# Patient Record
Sex: Female | Born: 1960
Health system: Southern US, Community
[De-identification: ages and names within clinical notes are randomized; demographics above are authoritative.]

## PROBLEM LIST (undated history)

## (undated) DIAGNOSIS — E039 Hypothyroidism, unspecified: Secondary | ICD-10-CM

## (undated) DIAGNOSIS — R011 Cardiac murmur, unspecified: Secondary | ICD-10-CM

## (undated) DIAGNOSIS — M549 Dorsalgia, unspecified: Secondary | ICD-10-CM

## (undated) DIAGNOSIS — K5730 Diverticulosis of large intestine without perforation or abscess without bleeding: Secondary | ICD-10-CM

## (undated) DIAGNOSIS — M25519 Pain in unspecified shoulder: Secondary | ICD-10-CM

## (undated) DIAGNOSIS — R0602 Shortness of breath: Secondary | ICD-10-CM

## (undated) DIAGNOSIS — D179 Benign lipomatous neoplasm, unspecified: Secondary | ICD-10-CM

## (undated) DIAGNOSIS — I340 Nonrheumatic mitral (valve) insufficiency: Secondary | ICD-10-CM

## (undated) DIAGNOSIS — R82998 Other abnormal findings in urine: Secondary | ICD-10-CM

## (undated) DIAGNOSIS — N62 Hypertrophy of breast: Secondary | ICD-10-CM

## (undated) DIAGNOSIS — E785 Hyperlipidemia, unspecified: Secondary | ICD-10-CM

## (undated) DIAGNOSIS — E663 Overweight: Secondary | ICD-10-CM

## (undated) DIAGNOSIS — D649 Anemia, unspecified: Secondary | ICD-10-CM

## (undated) DIAGNOSIS — I10 Essential (primary) hypertension: Secondary | ICD-10-CM

## (undated) DIAGNOSIS — J069 Acute upper respiratory infection, unspecified: Secondary | ICD-10-CM

## (undated) DIAGNOSIS — K219 Gastro-esophageal reflux disease without esophagitis: Secondary | ICD-10-CM

## (undated) HISTORY — DX: Cardiac murmur, unspecified: R01.1

## (undated) HISTORY — DX: Anemia, unspecified: D64.9

## (undated) HISTORY — DX: Overweight: E66.3

## (undated) HISTORY — DX: Dorsalgia, unspecified: M54.9

## (undated) HISTORY — DX: Acute upper respiratory infection, unspecified: J06.9

## (undated) HISTORY — DX: Other abnormal findings in urine: R82.998

## (undated) HISTORY — DX: Gastro-esophageal reflux disease without esophagitis: K21.9

## (undated) HISTORY — PX: TONSILLECTOMY AND ADENOIDECTOMY: SUR1326

## (undated) HISTORY — DX: Essential (primary) hypertension: I10

## (undated) HISTORY — DX: Diverticulosis of large intestine without perforation or abscess without bleeding: K57.30

## (undated) HISTORY — DX: Hypothyroidism, unspecified: E03.9

## (undated) HISTORY — DX: Pain in unspecified shoulder: M25.519

## (undated) HISTORY — DX: Hyperlipidemia, unspecified: E78.5

## (undated) HISTORY — PX: COLONOSCOPY: SHX174

## (undated) HISTORY — DX: Shortness of breath: R06.02

## (undated) HISTORY — DX: Nonrheumatic mitral (valve) insufficiency: I34.0

## (undated) HISTORY — DX: Hypertrophy of breast: N62

## (undated) HISTORY — DX: Benign lipomatous neoplasm, unspecified: D17.9

---

## 1999-09-28 ENCOUNTER — Other Ambulatory Visit: Admission: RE | Admit: 1999-09-28 | Discharge: 1999-09-28 | Payer: Self-pay | Admitting: Internal Medicine

## 1999-10-02 ENCOUNTER — Ambulatory Visit (HOSPITAL_COMMUNITY): Admission: RE | Admit: 1999-10-02 | Discharge: 1999-10-02 | Payer: Self-pay | Admitting: Internal Medicine

## 1999-10-02 ENCOUNTER — Encounter: Payer: Self-pay | Admitting: Internal Medicine

## 2000-11-03 ENCOUNTER — Ambulatory Visit (HOSPITAL_COMMUNITY): Admission: RE | Admit: 2000-11-03 | Discharge: 2000-11-03 | Payer: Self-pay | Admitting: Internal Medicine

## 2000-11-03 ENCOUNTER — Encounter: Payer: Self-pay | Admitting: Internal Medicine

## 2002-02-09 ENCOUNTER — Encounter: Payer: Self-pay | Admitting: Internal Medicine

## 2002-02-09 ENCOUNTER — Ambulatory Visit (HOSPITAL_COMMUNITY): Admission: RE | Admit: 2002-02-09 | Discharge: 2002-02-09 | Payer: Self-pay | Admitting: Internal Medicine

## 2002-07-08 ENCOUNTER — Other Ambulatory Visit: Admission: RE | Admit: 2002-07-08 | Discharge: 2002-07-08 | Payer: Self-pay | Admitting: Internal Medicine

## 2003-10-28 ENCOUNTER — Ambulatory Visit (HOSPITAL_COMMUNITY): Admission: RE | Admit: 2003-10-28 | Discharge: 2003-10-28 | Payer: Self-pay | Admitting: Internal Medicine

## 2004-06-07 ENCOUNTER — Ambulatory Visit: Payer: Self-pay | Admitting: Internal Medicine

## 2004-06-22 ENCOUNTER — Ambulatory Visit: Payer: Self-pay | Admitting: Internal Medicine

## 2004-06-27 ENCOUNTER — Ambulatory Visit: Payer: Self-pay | Admitting: Internal Medicine

## 2004-10-05 ENCOUNTER — Ambulatory Visit: Payer: Self-pay | Admitting: Pulmonary Disease

## 2004-10-10 ENCOUNTER — Ambulatory Visit: Payer: Self-pay | Admitting: Pulmonary Disease

## 2004-10-23 ENCOUNTER — Ambulatory Visit: Payer: Self-pay

## 2004-10-26 ENCOUNTER — Ambulatory Visit: Payer: Self-pay | Admitting: Cardiology

## 2004-11-01 ENCOUNTER — Ambulatory Visit: Payer: Self-pay

## 2004-11-05 ENCOUNTER — Ambulatory Visit (HOSPITAL_COMMUNITY): Admission: RE | Admit: 2004-11-05 | Discharge: 2004-11-05 | Payer: Self-pay | Admitting: Internal Medicine

## 2004-11-23 ENCOUNTER — Ambulatory Visit: Payer: Self-pay | Admitting: Cardiology

## 2004-11-26 ENCOUNTER — Encounter: Payer: Self-pay | Admitting: Internal Medicine

## 2004-11-26 LAB — CONVERTED CEMR LAB: Pap Smear: NORMAL

## 2005-02-04 ENCOUNTER — Ambulatory Visit: Payer: Self-pay | Admitting: Cardiology

## 2005-11-06 ENCOUNTER — Ambulatory Visit (HOSPITAL_COMMUNITY): Admission: RE | Admit: 2005-11-06 | Discharge: 2005-11-06 | Payer: Self-pay | Admitting: Internal Medicine

## 2005-12-12 ENCOUNTER — Ambulatory Visit: Payer: Self-pay | Admitting: Cardiology

## 2006-02-19 ENCOUNTER — Ambulatory Visit: Payer: Self-pay | Admitting: Internal Medicine

## 2006-02-20 ENCOUNTER — Ambulatory Visit: Payer: Self-pay | Admitting: Internal Medicine

## 2006-02-20 LAB — CONVERTED CEMR LAB
BUN: 7 mg/dL (ref 6–23)
Bacteria, U Microscopic: NEGATIVE /hpf
Basophils Absolute: 0.1 10*3/uL (ref 0.0–0.1)
Basophils Relative: 1.5 % — ABNORMAL HIGH (ref 0.0–1.0)
Chol/HDL Ratio, serum: 3.1
Crystals: NEGATIVE
GFR calc non Af Amer: 82 mL/min
Glucose, Bld: 96 mg/dL (ref 70–99)
LDL Cholesterol: 104 mg/dL — ABNORMAL HIGH (ref 0–99)
Leukocytes, UA: NEGATIVE
MCHC: 33.5 g/dL (ref 30.0–36.0)
MCV: 85.1 fL (ref 78.0–100.0)
Monocytes Absolute: 0.5 10*3/uL (ref 0.2–0.7)
Monocytes Relative: 9 % (ref 3.0–11.0)
Neutrophils Relative %: 49.4 % (ref 43.0–77.0)
Nitrite: NEGATIVE
Platelets: 411 10*3/uL — ABNORMAL HIGH (ref 150–400)
RBC: 4.15 M/uL (ref 3.87–5.11)
RDW: 14 % (ref 11.5–14.6)
TSH: 3.75 microintl units/mL (ref 0.35–5.50)
Total Protein, Urine: NEGATIVE mg/dL
Triglyceride fasting, serum: 63 mg/dL (ref 0–149)
Urobilinogen, UA: 0.2 (ref 0.0–1.0)
WBC: 5.7 10*3/uL (ref 4.5–10.5)

## 2006-11-18 ENCOUNTER — Ambulatory Visit (HOSPITAL_COMMUNITY): Admission: RE | Admit: 2006-11-18 | Discharge: 2006-11-18 | Payer: Self-pay | Admitting: Internal Medicine

## 2006-12-05 ENCOUNTER — Ambulatory Visit: Payer: Self-pay | Admitting: Internal Medicine

## 2006-12-05 LAB — CONVERTED CEMR LAB
AST: 19 units/L (ref 0–37)
Alkaline Phosphatase: 111 units/L (ref 39–117)
Bilirubin Urine: NEGATIVE
CO2: 30 meq/L (ref 19–32)
Chloride: 104 meq/L (ref 96–112)
Eosinophils Absolute: 0.2 10*3/uL (ref 0.0–0.6)
GFR calc Af Amer: 98 mL/min
GFR calc non Af Amer: 81 mL/min
Glucose, Bld: 97 mg/dL (ref 70–99)
Ketones, ur: NEGATIVE mg/dL
Leukocytes, UA: NEGATIVE
Lymphocytes Relative: 33.1 % (ref 12.0–46.0)
MCHC: 33.1 g/dL (ref 30.0–36.0)
Monocytes Relative: 9.8 % (ref 3.0–11.0)
Platelets: 397 10*3/uL (ref 150–400)
Potassium: 3.2 meq/L — ABNORMAL LOW (ref 3.5–5.1)
Sodium: 140 meq/L (ref 135–145)
TSH: 5.66 microintl units/mL — ABNORMAL HIGH (ref 0.35–5.50)
Total Protein, Urine: NEGATIVE mg/dL
VLDL: 12 mg/dL (ref 0–40)
WBC: 7 10*3/uL (ref 4.5–10.5)
pH: 6 (ref 5.0–8.0)

## 2006-12-08 ENCOUNTER — Encounter: Payer: Self-pay | Admitting: Internal Medicine

## 2006-12-08 ENCOUNTER — Ambulatory Visit: Payer: Self-pay | Admitting: Internal Medicine

## 2006-12-08 DIAGNOSIS — I10 Essential (primary) hypertension: Secondary | ICD-10-CM

## 2006-12-08 DIAGNOSIS — D649 Anemia, unspecified: Secondary | ICD-10-CM | POA: Insufficient documentation

## 2006-12-08 DIAGNOSIS — E876 Hypokalemia: Secondary | ICD-10-CM | POA: Insufficient documentation

## 2006-12-08 DIAGNOSIS — E785 Hyperlipidemia, unspecified: Secondary | ICD-10-CM

## 2006-12-08 DIAGNOSIS — M5431 Sciatica, right side: Secondary | ICD-10-CM | POA: Insufficient documentation

## 2006-12-08 DIAGNOSIS — M549 Dorsalgia, unspecified: Secondary | ICD-10-CM | POA: Insufficient documentation

## 2006-12-08 DIAGNOSIS — K219 Gastro-esophageal reflux disease without esophagitis: Secondary | ICD-10-CM

## 2006-12-08 HISTORY — DX: Hyperlipidemia, unspecified: E78.5

## 2006-12-08 HISTORY — DX: Essential (primary) hypertension: I10

## 2006-12-08 HISTORY — DX: Anemia, unspecified: D64.9

## 2006-12-08 HISTORY — DX: Dorsalgia, unspecified: M54.9

## 2006-12-08 HISTORY — DX: Gastro-esophageal reflux disease without esophagitis: K21.9

## 2006-12-10 ENCOUNTER — Telehealth: Payer: Self-pay | Admitting: Internal Medicine

## 2007-01-01 ENCOUNTER — Ambulatory Visit: Payer: Self-pay | Admitting: Internal Medicine

## 2007-01-02 ENCOUNTER — Ambulatory Visit: Payer: Self-pay | Admitting: Cardiology

## 2007-01-02 LAB — CONVERTED CEMR LAB
Basophils Absolute: 0 10*3/uL (ref 0.0–0.1)
Basophils Relative: 0.1 % (ref 0.0–1.0)
Eosinophils Absolute: 0.2 10*3/uL (ref 0.0–0.6)
Hemoglobin: 12.3 g/dL (ref 12.0–15.0)
Iron: 47 ug/dL (ref 42–145)
Lymphocytes Relative: 29 % (ref 12.0–46.0)
Monocytes Relative: 4.6 % (ref 3.0–11.0)
Platelets: 364 10*3/uL (ref 150–400)
Potassium: 4 meq/L (ref 3.5–5.1)
RBC: 4.36 M/uL (ref 3.87–5.11)

## 2007-03-09 ENCOUNTER — Ambulatory Visit: Payer: Self-pay | Admitting: Internal Medicine

## 2007-03-09 DIAGNOSIS — R197 Diarrhea, unspecified: Secondary | ICD-10-CM | POA: Insufficient documentation

## 2007-03-09 DIAGNOSIS — M25511 Pain in right shoulder: Secondary | ICD-10-CM | POA: Insufficient documentation

## 2007-03-09 DIAGNOSIS — M25519 Pain in unspecified shoulder: Secondary | ICD-10-CM

## 2007-03-09 HISTORY — DX: Pain in unspecified shoulder: M25.519

## 2007-05-14 ENCOUNTER — Encounter: Payer: Self-pay | Admitting: Internal Medicine

## 2007-05-25 ENCOUNTER — Encounter: Payer: Self-pay | Admitting: Internal Medicine

## 2007-11-19 ENCOUNTER — Ambulatory Visit (HOSPITAL_COMMUNITY): Admission: RE | Admit: 2007-11-19 | Discharge: 2007-11-19 | Payer: Self-pay | Admitting: Obstetrics and Gynecology

## 2007-12-14 ENCOUNTER — Encounter: Payer: Self-pay | Admitting: Internal Medicine

## 2007-12-30 ENCOUNTER — Ambulatory Visit: Payer: Self-pay | Admitting: Cardiology

## 2008-02-03 ENCOUNTER — Ambulatory Visit: Payer: Self-pay | Admitting: Internal Medicine

## 2008-02-04 LAB — CONVERTED CEMR LAB
ALT: 17 units/L (ref 0–35)
Albumin: 3.3 g/dL — ABNORMAL LOW (ref 3.5–5.2)
Alkaline Phosphatase: 103 units/L (ref 39–117)
Basophils Relative: 0.3 % (ref 0.0–3.0)
Calcium: 8.6 mg/dL (ref 8.4–10.5)
Chloride: 108 meq/L (ref 96–112)
Cholesterol: 175 mg/dL (ref 0–200)
Crystals: NEGATIVE
GFR calc Af Amer: 114 mL/min
GFR calc non Af Amer: 95 mL/min
HCT: 36.5 % (ref 36.0–46.0)
Ketones, ur: NEGATIVE mg/dL
MCHC: 32.8 g/dL (ref 30.0–36.0)
Monocytes Absolute: 0.9 10*3/uL (ref 0.1–1.0)
Potassium: 3.8 meq/L (ref 3.5–5.1)
Specific Gravity, Urine: 1.015 (ref 1.000–1.03)
TSH: 6.36 microintl units/mL — ABNORMAL HIGH (ref 0.35–5.50)
Total CHOL/HDL Ratio: 3.8
VLDL: 13 mg/dL (ref 0–40)
WBC: 9.9 10*3/uL (ref 4.5–10.5)
pH: 5.5 (ref 5.0–8.0)

## 2008-02-05 ENCOUNTER — Ambulatory Visit: Payer: Self-pay | Admitting: Internal Medicine

## 2008-02-05 DIAGNOSIS — E039 Hypothyroidism, unspecified: Secondary | ICD-10-CM

## 2008-02-05 DIAGNOSIS — N62 Hypertrophy of breast: Secondary | ICD-10-CM

## 2008-02-05 DIAGNOSIS — E079 Disorder of thyroid, unspecified: Secondary | ICD-10-CM | POA: Insufficient documentation

## 2008-02-05 HISTORY — DX: Hypothyroidism, unspecified: E03.9

## 2008-02-05 HISTORY — DX: Hypertrophy of breast: N62

## 2008-02-12 ENCOUNTER — Encounter (INDEPENDENT_AMBULATORY_CARE_PROVIDER_SITE_OTHER): Payer: Self-pay | Admitting: *Deleted

## 2008-08-11 ENCOUNTER — Telehealth (INDEPENDENT_AMBULATORY_CARE_PROVIDER_SITE_OTHER): Payer: Self-pay | Admitting: *Deleted

## 2008-08-12 ENCOUNTER — Telehealth: Payer: Self-pay | Admitting: Cardiology

## 2008-09-27 ENCOUNTER — Telehealth (INDEPENDENT_AMBULATORY_CARE_PROVIDER_SITE_OTHER): Payer: Self-pay | Admitting: *Deleted

## 2009-01-10 ENCOUNTER — Ambulatory Visit (HOSPITAL_COMMUNITY): Admission: RE | Admit: 2009-01-10 | Discharge: 2009-01-10 | Payer: Self-pay | Admitting: Obstetrics and Gynecology

## 2009-01-11 ENCOUNTER — Encounter (INDEPENDENT_AMBULATORY_CARE_PROVIDER_SITE_OTHER): Payer: Self-pay | Admitting: *Deleted

## 2009-01-12 DIAGNOSIS — R079 Chest pain, unspecified: Secondary | ICD-10-CM | POA: Insufficient documentation

## 2009-01-12 DIAGNOSIS — R0602 Shortness of breath: Secondary | ICD-10-CM

## 2009-01-12 HISTORY — DX: Shortness of breath: R06.02

## 2009-01-24 ENCOUNTER — Ambulatory Visit: Payer: Self-pay | Admitting: Cardiology

## 2009-02-03 ENCOUNTER — Ambulatory Visit: Payer: Self-pay | Admitting: Internal Medicine

## 2009-02-06 ENCOUNTER — Ambulatory Visit: Payer: Self-pay | Admitting: Internal Medicine

## 2009-02-06 LAB — CONVERTED CEMR LAB
AST: 19 units/L (ref 0–37)
Basophils Absolute: 0 10*3/uL (ref 0.0–0.1)
Basophils Relative: 0.7 % (ref 0.0–3.0)
Bilirubin Urine: NEGATIVE
CO2: 27 meq/L (ref 19–32)
Calcium: 8.4 mg/dL (ref 8.4–10.5)
Creatinine, Ser: 0.7 mg/dL (ref 0.4–1.2)
GFR calc non Af Amer: 113.72 mL/min (ref >60)
Glucose, Bld: 82 mg/dL (ref 70–99)
HCT: 33.9 % — ABNORMAL LOW (ref 36.0–46.0)
Iron: 37 ug/dL — ABNORMAL LOW (ref 42–145)
Ketones, ur: NEGATIVE mg/dL
Leukocytes, UA: NEGATIVE
Lymphs Abs: 2.2 10*3/uL (ref 0.7–4.0)
MCV: 77.4 fL — ABNORMAL LOW (ref 78.0–100.0)
Monocytes Absolute: 0.6 10*3/uL (ref 0.1–1.0)
Neutrophils Relative %: 53 % (ref 43.0–77.0)
Nitrite: NEGATIVE
Potassium: 3.3 meq/L — ABNORMAL LOW (ref 3.5–5.1)
Saturation Ratios: 8 % — ABNORMAL LOW (ref 20.0–50.0)
Specific Gravity, Urine: 1.02 (ref 1.000–1.030)
TSH: 5.65 microintl units/mL — ABNORMAL HIGH (ref 0.35–5.50)
Total CHOL/HDL Ratio: 3
Urobilinogen, UA: 0.2 (ref 0.0–1.0)
VLDL: 14.4 mg/dL (ref 0.0–40.0)
WBC: 6.7 10*3/uL (ref 4.5–10.5)
pH: 6 (ref 5.0–8.0)

## 2009-03-20 ENCOUNTER — Encounter (INDEPENDENT_AMBULATORY_CARE_PROVIDER_SITE_OTHER): Payer: Self-pay | Admitting: *Deleted

## 2009-03-24 ENCOUNTER — Ambulatory Visit: Payer: Self-pay | Admitting: Internal Medicine

## 2009-03-24 LAB — CONVERTED CEMR LAB
Chloride: 106 meq/L (ref 96–112)
Creatinine, Ser: 0.8 mg/dL (ref 0.4–1.2)
HCT: 35 % — ABNORMAL LOW (ref 36.0–46.0)
Iron: 18 ug/dL — ABNORMAL LOW (ref 42–145)
MCV: 78.8 fL (ref 78.0–100.0)
Monocytes Relative: 9.1 % (ref 3.0–12.0)
Neutro Abs: 4.2 10*3/uL (ref 1.4–7.7)
Platelets: 370 10*3/uL (ref 150.0–400.0)
RBC: 4.44 M/uL (ref 3.87–5.11)
Saturation Ratios: 4.2 % — ABNORMAL LOW (ref 20.0–50.0)
Sodium: 139 meq/L (ref 135–145)
WBC: 7.8 10*3/uL (ref 4.5–10.5)

## 2009-04-21 ENCOUNTER — Encounter (INDEPENDENT_AMBULATORY_CARE_PROVIDER_SITE_OTHER): Payer: Self-pay | Admitting: *Deleted

## 2009-04-24 ENCOUNTER — Ambulatory Visit: Payer: Self-pay | Admitting: Gastroenterology

## 2009-05-05 ENCOUNTER — Ambulatory Visit: Payer: Self-pay | Admitting: Gastroenterology

## 2009-08-25 LAB — HM MAMMOGRAPHY: HM Mammogram: NORMAL

## 2009-11-22 ENCOUNTER — Ambulatory Visit: Payer: Self-pay | Admitting: Internal Medicine

## 2009-11-22 LAB — CONVERTED CEMR LAB
Albumin: 3.6 g/dL (ref 3.5–5.2)
Alkaline Phosphatase: 103 units/L (ref 39–117)
Basophils Absolute: 0 10*3/uL (ref 0.0–0.1)
Basophils Relative: 0.5 % (ref 0.0–3.0)
Bilirubin Urine: NEGATIVE
Bilirubin, Direct: 0 mg/dL (ref 0.0–0.3)
Calcium: 9 mg/dL (ref 8.4–10.5)
Eosinophils Relative: 5.1 % — ABNORMAL HIGH (ref 0.0–5.0)
GFR calc non Af Amer: 97.17 mL/min (ref >60)
Glucose, Bld: 90 mg/dL (ref 70–99)
Hemoglobin: 11.8 g/dL — ABNORMAL LOW (ref 12.0–15.0)
Ketones, ur: NEGATIVE mg/dL
MCHC: 32.9 g/dL (ref 30.0–36.0)
MCV: 80.7 fL (ref 78.0–100.0)
Monocytes Absolute: 0.4 10*3/uL (ref 0.1–1.0)
Monocytes Relative: 8 % (ref 3.0–12.0)
Neutrophils Relative %: 43.7 % (ref 43.0–77.0)
Nitrite: NEGATIVE
Platelets: 338 10*3/uL (ref 150.0–400.0)
Sodium: 142 meq/L (ref 135–145)
Specific Gravity, Urine: >=1.03 (ref 1.000–1.030)
TSH: 2.73 microintl units/mL (ref 0.35–5.50)
Total Bilirubin: 0.3 mg/dL (ref 0.3–1.2)
Total CHOL/HDL Ratio: 4
Triglycerides: 98 mg/dL (ref 0.0–149.0)
Urine Glucose: NEGATIVE mg/dL
Urobilinogen, UA: 0.2 (ref 0.0–1.0)
WBC: 5.4 10*3/uL (ref 4.5–10.5)

## 2009-11-23 ENCOUNTER — Ambulatory Visit: Payer: Self-pay | Admitting: Internal Medicine

## 2009-11-23 DIAGNOSIS — R82998 Other abnormal findings in urine: Secondary | ICD-10-CM | POA: Insufficient documentation

## 2009-11-23 DIAGNOSIS — J069 Acute upper respiratory infection, unspecified: Secondary | ICD-10-CM | POA: Insufficient documentation

## 2009-11-23 HISTORY — DX: Other abnormal findings in urine: R82.998

## 2009-11-23 HISTORY — DX: Acute upper respiratory infection, unspecified: J06.9

## 2010-01-11 ENCOUNTER — Ambulatory Visit: Payer: Self-pay | Admitting: Cardiology

## 2010-01-11 ENCOUNTER — Encounter: Payer: Self-pay | Admitting: Cardiology

## 2010-01-11 DIAGNOSIS — E663 Overweight: Secondary | ICD-10-CM | POA: Insufficient documentation

## 2010-01-11 HISTORY — DX: Overweight: E66.3

## 2010-01-23 ENCOUNTER — Ambulatory Visit (HOSPITAL_COMMUNITY): Admission: RE | Admit: 2010-01-23 | Payer: Self-pay | Admitting: Obstetrics and Gynecology

## 2010-02-25 HISTORY — PX: BREAST REDUCTION SURGERY: SHX8

## 2010-03-17 ENCOUNTER — Encounter: Payer: Self-pay | Admitting: Obstetrics and Gynecology

## 2010-03-27 NOTE — Assessment & Plan Note (Signed)
Summary: yearly f/u ./cy   Visit Type:  1 yr f/u Primary Provider:  Corwin Levins MD  CC:  pt offers no cardiac complaints today.  History of Present Illness:  [name] [name] comes in today for followup of her chest discomfort and cardiac risk factors. She is followed very closely by primary care.  She has chest discomfort described as a midsternal burning when she lays down. It is not related to exertion. There is no nausea or vomiting. She denies abdominal pain or melena.  She's compliant with her medications. She is put off exercise program and continues to show with her weight.  Her lipids were reviewed with her today. Her total cholesterol was 176, triglycerides 98, HDL 45, LDL 111. Fasting blood sugar was normal.    Current Medications (verified): 1)  Ecotrin Low Strength 81 Mg  Tbec (Aspirin) .Marland Kitchen.. 1 Once Daily Po 2)  Klor-Con 10 10 Meq Cr-Tabs (Potassium Chloride) .Marland Kitchen.. 1 By Mouth Once Daily 3)  Diovan Hct 80-12.5 Mg Tabs (Valsartan-Hydrochlorothiazide) .Marland Kitchen.. 1 By Mouth Once Daily 4)  Levothyroxine Sodium 50 Mcg Tabs (Levothyroxine Sodium) .Marland Kitchen.. 1 By Mouth Once Daily  Allergies: 1)  Darvon  Past History:  Past Medical History: Last updated: 02/06/2009 CHEST PAIN (ICD-786.50) SHORTNESS OF BREATH (ICD-786.05) HYPERLIPIDEMIA (ICD-272.4) HYPERTENSION (ICD-401.9) GERD (ICD-530.81) BREAST HYPERTROPHY (ICD-611.1) HYPOTHYROIDISM (ICD-244.9) PREVENTIVE HEALTH CARE (ICD-V70.0) DIARRHEA (ICD-787.91) SHOULDER PAIN, RIGHT (ICD-719.41) - right bone spur  BACK PAIN (ICD-724.5) HYPOKALEMIA (ICD-276.8) PREVENTIVE HEALTH CARE (ICD-V70.0) FAMILY HISTORY BREAST CANCER 1ST DEGREE RELATIVE <50 (ICD-V16.3) ANEMIA-NOS (ICD-285.9)    Past Surgical History: Last updated: 12/08/2006 none  Family History: Last updated: 02/05/2008 Family History Breast cancer 1st degree relative - 2 aunts and 2 cousins mother with DM, several strokes, HTN sister and brother with HTN as well   Social  History: Last updated: 11/23/2009 Never Smoked Alcohol use-yes 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  no acute distress Head:  normocephalic and atraumatic Eyes:  PERRLA/EOM intact; conjunctiva and lids normal. Neck:  Neck supple, no JVD. No masses, thyromegaly or abnormal cervical nodes. Chest [name] [name]:  no deformities or breast masses noted Lungs:  Clear bilaterally to auscultation and percussion. Heart:  PMI poorly appreciated, soft S1-S2, regular rate and rhythm, no bruits Msk:  Back normal, normal gait. Muscle strength and tone normal. Pulses:  pulses normal in all 4 extremities Extremities:  No clubbing or cyanosis. Neurologic:  Alert and oriented x 3. Skin:  Intact without lesions or rashes. Psych:  Normal affect.   Problems:  Medical Problems Added: 1)  Dx of Overweight/obesity  (ICD-278.02)  Impression & Recommendations:  Problem # 1:  CHEST PAIN (ICD-786.50) Assessment Improved  Her chest discomfort and frequent and clearly sounds like reflux. Her updated medication list for this problem includes:    Ecotrin Low Strength 81 Mg  Tbec (Aspirin) .Marland Kitchen... 1 once daily po  Orders: EKG w/ Interpretation (93000)  Problem # 2:  HYPERLIPIDEMIA (ICD-272.4) I have advised losing weight and exercise to bring her HDL above 50.  Problem # 3:  HYPERTENSION (ICD-401.9) Assessment: Improved  Her updated medication list for this problem includes:    Ecotrin Low Strength 81 Mg Tbec  (Aspirin) .Marland Kitchen... 1 once daily po    Diovan Hct 80-12.5 Mg Tabs (Valsartan-hydrochlorothiazide) .Marland Kitchen... 1 by mouth once daily  Patient Instructions: 1)  Your physician recommends that you schedule a follow-up appointment in: 1 year with Dr. Daleen Squibb 2)  Your physician recommends that you continue on your current medications as directed. Please refer to the Current Medication list given to you today.

## 2010-03-27 NOTE — Procedures (Signed)
Summary: Colonoscopy  Patient: [name] Note: All result statuses are Final unless otherwise noted.  Tests: (1) Colonoscopy (COL)   COL Colonoscopy           DONE     [facility]     [address]     [place], [place]  [address]           COLONOSCOPY PROCEDURE REPORT           PATIENT:  [name], [name]  MR#:  [id number]     BIRTHDATE:  1960/07/30, 48 yrs. old  GENDER:  female           ENDOSCOPIST:  Barbette Hair. Arlyce Dice, MD     Referred by:  Oliver Barre, M.D.           PROCEDURE DATE:  05/05/2009     PROCEDURE:  Colonoscopy, Diagnostic     ASA CLASS:  Class II     INDICATIONS:  Routine Risk Screening           MEDICATIONS:   Fentanyl 75 mcg IV, Versed 8 mg IV           DESCRIPTION OF PROCEDURE:   After the risks benefits and     alternatives of the procedure were thoroughly explained, informed     consent was obtained.  Digital rectal exam was performed and     revealed no abnormalities.   The LB [id number] endoscope     was introduced through the anus and advanced to the cecum, which     was identified by both the appendix and ileocecal valve, without     limitations.  The quality of the prep was excellent, using     MoviPrep.  The instrument was then slowly withdrawn as the colon     was fully examined.     <<PROCEDUREIMAGES>>           FINDINGS:  Mild diverticulosis was found sigmoid to descending .     The exam was otherwise normal (see image1, image2, image3, image6,     image7, image9, image10, image15, and image16).   Retroflexed     views in the rectum revealed no abnormalities.    The scope was     then withdrawn from the patient and the procedure completed.           COMPLICATIONS:  None           ENDOSCOPIC IMPRESSION:     1) Mild diverticulosis in the sigmoid to descending           RECOMMENDATIONS:     1) Continue current colorectal screening recommendations for     "routine risk" patients with a repeat colonoscopy in 10 years.          REPEAT EXAM:  In 10 year(s) for Colonoscopy.           ______________________________     Barbette Hair. Arlyce Dice, MD           CC:           n.     eSIGNED:   Barbette Hair. Roniesha Hollingshead at 05/05/2009 10:49 AM           Carleene Overlie, [id number]  Note: An exclamation mark (!) indicates a result that was not dispersed into the flowsheet. Document Creation Date: 05/05/2009 10:50 AM _______________________________________________________________________  (1) Order result status: Final Collection or observation date-time: 05/05/2009 10:42 Requested date-time:  Receipt date-time:  Reported date-time:  Referring  Physician:   Ordering Physician: Melvia Heaps [phone]) Specimen Source:  Source: Launa Grill Order Number: [id number] Lab site:   Appended Document: Colonoscopy    Clinical Lists Changes  Observations: Added new observation of COLONNXTDUE: 04/2019 (05/05/2009 11:14)

## 2010-03-27 NOTE — Assessment & Plan Note (Signed)
Summary: FU Natale Milch  #   Vital Signs:  Patient profile:   50 year old female Height:      62 inches Weight:      188.38 pounds BMI:     34.58 O2 Sat:      98 % on Room air Temp:     99 degrees F oral Pulse rate:   87 / minute BP sitting:   110 / 78  (left arm) Cuff size:   regular  Vitals Entered By: Zella Ball Ewing CMA Duncan Dull) (November 23, 2009 3:29 PM)  O2 Flow:  Room air  Preventive Care Screening  Pap Smear:    Date:  08/25/2009    Results:  normal   Mammogram:    Date:  08/25/2009    Results:  normal   CC: followup/RE   Primary Care Provider:  Corwin Levins MD  CC:  followup/RE.  History of Present Illness: here for wellness, incidently with 2 to 3 days nausea and dizziness;  was unaware of fever today, just seemed to get over a recent URI;  recetnly joined the gym for more excericse; trying to lose wt for a wedding next yr;   Pt denies CP, worsening sob, doe, wheezing, orthopnea, pnd, worsening LE edema, palps, dizziness or syncope  Pt denies new neuro symptoms such as headache, facial or extremity weakness  Pt denies polydipsia, polyuria, or low sugar symptoms such as shakiness improved with eating.  Overall good compliance with meds, trying to follow low chol diet, wt stable, little excercise however    Preventive Screening-Counseling & Management      Drug Use:  no.    Problems Prior to Update: 1)  Uri  (ICD-465.9) 2)  Urinalysis, Abnormal  (ICD-791.9) 3)  Chest Pain  (ICD-786.50) 4)  Shortness of Breath  (ICD-786.05) 5)  Hyperlipidemia  (ICD-272.4) 6)  Hypertension  (ICD-401.9) 7)  Gerd  (ICD-530.81) 8)  Breast Hypertrophy  (ICD-611.1) 9)  Hypothyroidism  (ICD-244.9) 10)  Preventive Health Care  (ICD-V70.0) 11)  Diarrhea  (ICD-787.91) 12)  Shoulder Pain, Right  (ICD-719.41) 13)  Back Pain  (ICD-724.5) 14)  Hypokalemia  (ICD-276.8) 15)  Preventive Health Care  (ICD-V70.0) 16)  Family History Breast Cancer 1st Degree Relative <50  (ICD-V16.3) 17)   Anemia-nos  (ICD-285.9)  Medications Prior to Update: 1)  Ecotrin Low Strength 81 Mg  Tbec (Aspirin) .Marland Kitchen.. 1 Once Daily Po 2)  Klor-Con 10 10 Meq Cr-Tabs (Potassium Chloride) .... 2 By Mouth Once Daily 3)  Diovan Hct 80-12.5 Mg Tabs (Valsartan-Hydrochlorothiazide) .Marland Kitchen.. 1 By Mouth Once Daily 4)  Levothyroxine Sodium 50 Mcg Tabs (Levothyroxine Sodium) .Marland Kitchen.. 1 By Mouth Once Daily  Current Medications (verified): 1)  Ecotrin Low Strength 81 Mg  Tbec (Aspirin) .Marland Kitchen.. 1 Once Daily Po 2)  Klor-Con 10 10 Meq Cr-Tabs (Potassium Chloride) .Marland Kitchen.. 1 By Mouth Once Daily 3)  Diovan Hct 80-12.5 Mg Tabs (Valsartan-Hydrochlorothiazide) .Marland Kitchen.. 1 By Mouth Once Daily 4)  Levothyroxine Sodium 50 Mcg Tabs (Levothyroxine Sodium) .Marland Kitchen.. 1 By Mouth Once Daily  Allergies (verified): 1)  Darvon  Past History:  Past Medical History: Last updated: 02/06/2009 CHEST PAIN (ICD-786.50) SHORTNESS OF BREATH (ICD-786.05) HYPERLIPIDEMIA (ICD-272.4) HYPERTENSION (ICD-401.9) GERD (ICD-530.81) BREAST HYPERTROPHY (ICD-611.1) HYPOTHYROIDISM (ICD-244.9) PREVENTIVE HEALTH CARE (ICD-V70.0) DIARRHEA (ICD-787.91) SHOULDER PAIN, RIGHT (ICD-719.41) - right bone spur  BACK PAIN (ICD-724.5) HYPOKALEMIA (ICD-276.8) PREVENTIVE HEALTH CARE (ICD-V70.0) FAMILY HISTORY BREAST CANCER 1ST DEGREE RELATIVE <50 (ICD-V16.3) ANEMIA-NOS (ICD-285.9)    Past Surgical History: Last updated: 12/08/2006 none  Family History: Last updated: 02/05/2008 Family History Breast cancer 1st degree relative - 2 aunts and 2 cousins mother with DM, several strokes, HTN sister and brother with HTN as well   Social History: Last updated: 11/23/2009 Never Smoked Alcohol use-yes 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 Drug use-no  Risk Factors: Smoking Status: never (12/08/2006)  Social History: Never Smoked Alcohol use-yes 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         all otherwise negative per pt -  denies dysuria or freq/urgency  Physical Exam  General:  alert and overweight-appearing.   Head:  normocephalic and atraumatic.   Eyes:  vision grossly intact, pupils equal, and pupils round.   Ears:  left tm mild erythema, canals clear Nose:  no external deformity and no nasal discharge.   Mouth:  pharyngeal erythema and fair dentition.   Neck:  supple and no masses.   Lungs:  normal respiratory effort and normal breath sounds.   Heart:  normal rate and regular rhythm.   Abdomen:  soft, non-tender, and normal bowel sounds.   Msk:  no joint tenderness and no joint swelling.   Extremities:  no edema, no erythema  Neurologic:  cranial nerves II-XII intact and strength normal in all extremities.   Skin:  color normal and no rashes.   Psych:  not anxious appearing and not depressed appearing.     Impression & Recommendations:  Problem # 1:  Preventive Health Care (ICD-V70.0) Overall doing well, age appropriate education and counseling updated and referral for appropriate preventive services done unless declined, immunizations up to date or declined, diet counseling done if overweight, urged to quit smoking if smokes , most recent labs reviewed and current ordered if appropriate, ecg reviewed or declined (interpretation per ECG scanned in the EMR if done); information regarding Medicare Prevention requirements given if appropriate; speciality referrals updated as appropriate    Problem # 2:  HYPOKALEMIA (ICD-276.8) resolved, now K 5.2 - ok to reduce the K to one pill per day  Problem # 3:  HYPERTENSION (ICD-401.9)  Her updated medication list for this problem includes:    Diovan Hct 80-12.5 Mg Tabs (Valsartan-hydrochlorothiazide) .Marland Kitchen... 1 by mouth once daily  BP today: 110/78 Prior BP: 110/70 (02/06/2009)  Prior 10 Yr Risk Heart Disease: 5 % (01/24/2009)  Labs Reviewed: K+: 5.2 (11/22/2009) Creat: : 0.8 (11/22/2009)   Chol: 176 (11/22/2009)   HDL: 45.30 (11/22/2009)   LDL: 111 (11/22/2009)   TG: 98.0 (11/22/2009) stable overall by hx and exam, ok to continue meds/tx as is   Problem # 4:  HYPERLIPIDEMIA (ICD-272.4)  Labs Reviewed: SGOT: 21 (11/22/2009)   SGPT: 19 (11/22/2009)  Prior 10 Yr Risk Heart Disease: 5 % (01/24/2009)   HDL:45.30 (11/22/2009), 52.60 (02/03/2009)  LDL:111 (11/22/2009), 115 (02/03/2009)  Chol:176 (11/22/2009), 182 (02/03/2009)  Trig:98.0 (11/22/2009), 72.0 (02/03/2009) d/w pt - for diet for now, declines statin  Problem # 5:  URINALYSIS, ABNORMAL (ICD-791.9) pt concernd about pyuria which is minor and essenatily asmpt but will check  urine cx Orders: T-Culture, Urine ([id number])  Problem # 6:  ANEMIA-NOS (ICD-285.9) very mild, ok for MVI with iron supplement  Problem # 7:  URI (ICD-465.9)  Her updated medication list for this problem includes:    Ecotrin Low Strength 81 Mg Tbec (Aspirin) .Marland Kitchen... 1 once daily po mild, resolving, with left eustachain dysfucntion, mild vertigo and nausea - for mucinex otc as needed   Complete Medication List: 1)  Ecotrin Low Strength 81 Mg Tbec (Aspirin) .Marland Kitchen.. 1 once daily po 2)  Klor-con 10 10 Meq Cr-tabs (Potassium chloride) .Marland Kitchen.. 1 by mouth once daily 3)  Diovan Hct 80-12.5 Mg Tabs (Valsartan-hydrochlorothiazide) .Marland Kitchen.. 1 by mouth once daily 4)  Levothyroxine Sodium 50 Mcg Tabs (Levothyroxine sodium) .Marland Kitchen.. 1 by mouth once daily  Patient Instructions: 1)  Please go to the Lab in the basement  for your urine tests today  (the urine culture) 2)  Please call the number on the [facility] Card for results of your testing  3)  You can also use Mucinex OTC or it's generic for congestion  4)  Continue all previous medications as before this visit  5)  Please schedule a follow-up appointment in 1 year, or sooner if needed Prescriptions: LEVOTHYROXINE SODIUM 50 MCG TABS (LEVOTHYROXINE SODIUM) 1 by mouth once daily  #90 x 3   Entered and Authorized by:   Corwin Levins MD   Signed by:   Corwin Levins MD on 11/23/2009   Method used:   Print then Give to Patient   RxID:   [id number] DIOVAN HCT 80-12.5 MG TABS (VALSARTAN-HYDROCHLOROTHIAZIDE) 1 by mouth once daily  #90 x 3   Entered and Authorized by:   Corwin Levins MD   Signed by:   Corwin Levins MD on 11/23/2009   Method used:   Print then Give to Patient   RxID:   [id number] KLOR-CON 10 10 MEQ CR-TABS (POTASSIUM CHLORIDE) 1 by mouth once daily  #90 x 3   Entered and Authorized by:   Corwin Levins MD   Signed by:   Corwin Levins MD on 11/23/2009   Method used:   Print then Give to Patient   RxID:   [id number]

## 2010-03-27 NOTE — Letter (Signed)
Summary: [facility] Instructions  [facility]  [address] [place], [place] [address]   Phone: [phone]  Fax: [phone]       [name] [name]    03/27/1960    MRN: [id number]        Procedure Day Dorna Bloom:  Farrell Ours  05/05/09     Arrival Time:  9:00AM     Procedure Time:  10:00AM     Location of Procedure:                    X  [facility] (4th Floor)                        PREPARATION FOR COLONOSCOPY WITH MOVIPREP   Starting 5 days prior to your procedure 04/30/09 do not eat nuts, seeds, popcorn, corn, beans, peas,  salads, or any raw vegetables.  Do not take any fiber supplements (e.g. Metamucil, Citrucel, and Benefiber).  THE DAY BEFORE YOUR PROCEDURE         DATE: 05/04/09  DAY: THURSDAY  1.  Drink clear liquids the entire day-NO SOLID FOOD  2.  Do not drink anything colored red or purple.  Avoid juices with pulp.  No orange juice.  3.  Drink at least 64 oz. (8 glasses) of fluid/clear liquids during the day to prevent dehydration and help the prep work efficiently.  CLEAR LIQUIDS INCLUDE: Water Jello Ice Popsicles Tea (sugar ok, no milk/cream) Powdered fruit flavored drinks Coffee (sugar ok, no milk/cream) Gatorade Juice: apple, white grape, white cranberry  Lemonade Clear bullion, consomm, broth Carbonated beverages (any kind) Strained chicken noodle soup Hard Candy                             4.  In the morning, mix first dose of MoviPrep solution:    Empty 1 Pouch A and 1 Pouch B into the disposable container    Add lukewarm drinking water to the top line of the container. Mix to dissolve    Refrigerate (mixed solution should be used within 24 hrs)  5.  Begin drinking the prep at 5:00 p.m. The MoviPrep container is divided by 4 marks.   Every 15 minutes drink the solution down to the next mark (approximately 8 oz) until the full liter is complete.   6.  Follow completed prep with 16 oz of clear liquid of your choice (Nothing  red or purple).  Continue to drink clear liquids until bedtime.  7.  Before going to bed, mix second dose of MoviPrep solution:    Empty 1 Pouch A and 1 Pouch B into the disposable container    Add lukewarm drinking water to the top line of the container. Mix to dissolve    Refrigerate  THE DAY OF YOUR PROCEDURE      DATE: 05/05/09 DAY: FRIDAY  Beginning at 5:00AM 5 hours before procedure):         1. Every 15 minutes, drink the solution down to the next mark (approx 8 oz) until the full liter is complete.  2. Follow completed prep with 16 oz. of clear liquid of your choice.    3. You may drink clear liquids until 8:00AM (2 HOURS BEFORE PROCEDURE).   MEDICATION INSTRUCTIONS  Unless otherwise instructed, you should take regular prescription medications with a small sip of water   as early as possible the morning of your  procedure.    Additional medication instructions: Hold diovan morning of procedure         OTHER INSTRUCTIONS  You will need a responsible adult at least 52 years of age to accompany you and drive you home.   This person must remain in the waiting room during your procedure.  Wear loose fitting clothing that is easily removed.  Leave jewelry and other valuables at home.  However, you may wish to bring a book to read or  an iPod/MP3 player to listen to music as you wait for your procedure to start.  Remove all body piercing jewelry and leave at home.  Total time from sign-in until discharge is approximately 2-3 hours.  You should go home directly after your procedure and rest.  You can resume normal activities the  day after your procedure.  The day of your procedure you should not:   Drive   Make legal decisions   Operate machinery   Drink alcohol   Return to work  You will receive specific instructions about eating, activities and medications before you leave.    The above instructions have been reviewed and explained to me by   Sherren Kerns RN  April 24, 2009 5:00 PM     I fully understand and can verbalize these instructions _____________________________ Date _________

## 2010-03-27 NOTE — Letter (Signed)
Summary: Previsit letter  [facility]  [address] [place], [place] [address]   Phone: [phone]  Fax: [phone]       03/20/2009 MRN: [id number]  [facility] [address] [place], [place]  [address]  Dear Ms. Bhandari,  Welcome to the Gastroenterology Division at [facility].    You are scheduled to see a nurse for your pre-procedure visit on 04/12/2009 at 2:00PM on the 3rd floor at [facility], [address]  We ask that you try to arrive at our office 15 minutes prior to your appointment time to allow for check-in.  Your nurse visit will consist of discussing your medical and surgical history, your immediate family medical history, and your medications.    Please bring a complete list of all your medications or, if you prefer, bring the medication bottles and we will list them.  We will need to be aware of both prescribed and over the counter drugs.  We will need to know exact dosage information as well.  If you are on blood thinners (Coumadin, Plavix, Aggrenox, Ticlid, etc.) please call our office today/prior to your appointment, as we need to consult with your physician about holding your medication.   Please be prepared to read and sign documents such as consent forms, a financial agreement, and acknowledgement forms.  If necessary, and with your consent, a friend or relative is welcome to sit-in on the nurse visit with you.  Please bring your insurance card so that we may make a copy of it.  If your insurance requires a referral to see a specialist, please bring your referral form from your primary care physician.  No co-pay is required for this nurse visit.     If you cannot keep your appointment, please call [phone] to cancel or reschedule prior to your appointment date.  This allows [place] the opportunity to schedule an appointment for another patient in need of care.    Thank you for choosing [facility] for your  medical needs.  We appreciate the opportunity to care for you.  Please visit [place] at our website  to learn more about our practice.                     Sincerely.                                                                                                                   The Gastroenterology Division

## 2010-03-27 NOTE — Miscellaneous (Signed)
Summary: previsit/rm  Clinical Lists Changes  Medications: Added new medication of MOVIPREP 100 GM  SOLR (PEG-KCL-NACL-NASULF-NA ASC-C) As per prep instructions. - Signed Rx of MOVIPREP 100 GM  SOLR (PEG-KCL-NACL-NASULF-NA ASC-C) As per prep instructions.;  #1 x 0;  Signed;  Entered by: Sherren Kerns RN;  Authorized by: Louis Meckel MD;  Method used: Electronically to [facility] [address], [place], [place], [place]  [address], Ph: [phone], Fax: [phone] Observations: Added new observation of ALLERGY REV: Done (04/24/2009 16:14)    Prescriptions: MOVIPREP 100 GM  SOLR (PEG-KCL-NACL-NASULF-NA ASC-C) As per prep instructions.  #1 x 0   Entered by:   Sherren Kerns RN   Authorized by:   Louis Meckel MD   Signed by:   Sherren Kerns RN on 04/24/2009   Method used:   Electronically to        [facility] (retail)       [address]       [place], [place]  [address]       Ph: [phone]       Fax: [phone]   RxID:   [id number]

## 2010-04-18 ENCOUNTER — Other Ambulatory Visit: Payer: Self-pay | Admitting: Internal Medicine

## 2010-04-18 ENCOUNTER — Encounter (INDEPENDENT_AMBULATORY_CARE_PROVIDER_SITE_OTHER): Payer: Self-pay | Admitting: *Deleted

## 2010-04-18 ENCOUNTER — Ambulatory Visit (INDEPENDENT_AMBULATORY_CARE_PROVIDER_SITE_OTHER): Payer: Self-pay | Admitting: Internal Medicine

## 2010-04-18 ENCOUNTER — Encounter: Payer: Self-pay | Admitting: Internal Medicine

## 2010-04-18 ENCOUNTER — Telehealth: Payer: Self-pay | Admitting: Internal Medicine

## 2010-04-18 ENCOUNTER — Other Ambulatory Visit: Payer: 59

## 2010-04-18 DIAGNOSIS — K5730 Diverticulosis of large intestine without perforation or abscess without bleeding: Secondary | ICD-10-CM | POA: Insufficient documentation

## 2010-04-18 DIAGNOSIS — R51 Headache: Secondary | ICD-10-CM

## 2010-04-18 DIAGNOSIS — M25579 Pain in unspecified ankle and joints of unspecified foot: Secondary | ICD-10-CM

## 2010-04-18 DIAGNOSIS — R509 Fever, unspecified: Secondary | ICD-10-CM

## 2010-04-18 DIAGNOSIS — D179 Benign lipomatous neoplasm, unspecified: Secondary | ICD-10-CM | POA: Insufficient documentation

## 2010-04-18 DIAGNOSIS — R519 Headache, unspecified: Secondary | ICD-10-CM | POA: Insufficient documentation

## 2010-04-18 HISTORY — DX: Diverticulosis of large intestine without perforation or abscess without bleeding: K57.30

## 2010-04-18 HISTORY — DX: Benign lipomatous neoplasm, unspecified: D17.9

## 2010-04-18 LAB — URINALYSIS, ROUTINE W REFLEX MICROSCOPIC
Total Protein, Urine: NEGATIVE
pH: 7 (ref 5.0–8.0)

## 2010-04-24 NOTE — Progress Notes (Signed)
----   Converted from flag ---- ---- 04/18/2010 1:09 PM, Corwin Levins MD wrote: unless she has a health savings accnt and does it for tax purposes, this is not needed  ---- 04/18/2010 12:54 PM, Zella Ball Ewing CMA (AAMA) wrote: this pt. meant to ask you for a prescription for ASA thinks may be more economical than getting OTC ------------------------------  called pt left msg. to call back called pt left msg. to call back  patient has been informed

## 2010-04-24 NOTE — Assessment & Plan Note (Signed)
Summary: KNOT ON AKLE/NWS   Vital Signs:  Patient profile:   50 year old female Height:      62 inches Weight:      193 pounds BMI:     35.43 O2 Sat:      97 % on Room air Temp:     99.2 degrees F oral Pulse rate:   88 / minute BP sitting:   100 / 70  (left arm) Cuff size:   regular  Vitals Entered By: Zella Ball Ewing CMA Duncan Dull) (April 18, 2010 11:32 AM)  O2 Flow:  Room air CC: Knot on right ankle/RE   Primary Care Provider:  Corwin Levins MD  CC:  Knot on right ankle/RE.  History of Present Illness: here for workin acute visit - overall doing ok except has notice a lump to the right lateral ankle that is soft, nontender with rednes , has been there at least 6 mo and not sure if it is getting larger or not;  does also have some dicomfort off and on to the ankle itself without swelling, though no gait change and not activity limiting.  No prior hx of ankle problems;  does have high arches to both feet and have not been an issue previously.  Pt denies CP, worsening sob, doe, wheezing, orthopnea, pnd, worsening LE edema, palps, dizziness or syncope  Pt denies new neuro symptoms such as facial or extremity weakness, but has had recurrant mild throbbing headache, mild with phonophobia lasting a few hrs todays  several times in the past month.    Pt denies polydipsia, polyuria.  Pt is unaware of any fever today;  no HA, ST, cough;  also no dysuria, urgency or hematira, or back pain, but may have had some urinary  freq in the past few days.  No recent wt loss, night sweats, loss of appetite or other constitutional symptoms Denies worsening depressive symptoms, suicidal ideation, or panic, though has ongoing anxiety, no worse recetnly per pt  Problems Prior to Update: 1)  Headache  (ICD-784.0) 2)  Fever Unspecified  (ICD-780.60) 3)  Ankle Pain, Right  (ICD-719.47) 4)  Lipoma  (ICD-214.9) 5)  Diverticulosis, Colon  (ICD-562.10) 6)  Overweight/obesity  (ICD-278.02) 7)  Uri  (ICD-465.9) 8)   Urinalysis, Abnormal  (ICD-791.9) 9)  Chest Pain  (ICD-786.50) 10)  Shortness of Breath  (ICD-786.05) 11)  Hyperlipidemia  (ICD-272.4) 12)  Hypertension  (ICD-401.9) 13)  Gerd  (ICD-530.81) 14)  Breast Hypertrophy  (ICD-611.1) 15)  Hypothyroidism  (ICD-244.9) 16)  Preventive Health Care  (ICD-V70.0) 17)  Diarrhea  (ICD-787.91) 18)  Shoulder Pain, Right  (ICD-719.41) 19)  Back Pain  (ICD-724.5) 20)  Hypokalemia  (ICD-276.8) 21)  Preventive Health Care  (ICD-V70.0) 22)  Family History Breast Cancer 1st Degree Relative <50  (ICD-V16.3) 23)  Anemia-nos  (ICD-285.9)  Medications Prior to Update: 1)  Ecotrin Low Strength 81 Mg  Tbec (Aspirin) .Marland Kitchen.. 1 Once Daily Po 2)  Klor-Con 10 10 Meq Cr-Tabs (Potassium Chloride) .Marland Kitchen.. 1 By Mouth Once Daily 3)  Diovan Hct 80-12.5 Mg Tabs (Valsartan-Hydrochlorothiazide) .Marland Kitchen.. 1 By Mouth Once Daily 4)  Levothyroxine Sodium 50 Mcg Tabs (Levothyroxine Sodium) .Marland Kitchen.. 1 By Mouth Once Daily  Current Medications (verified): 1)  Ecotrin Low Strength 81 Mg  Tbec (Aspirin) .Marland Kitchen.. 1 Once Daily Po 2)  Klor-Con 10 10 Meq Cr-Tabs (Potassium Chloride) .Marland Kitchen.. 1 By Mouth Once Daily 3)  Diovan Hct 80-12.5 Mg Tabs (Valsartan-Hydrochlorothiazide) .Marland Kitchen.. 1 By Mouth Once Daily 4)  Levothyroxine  Sodium 50 Mcg Tabs (Levothyroxine Sodium) .Marland Kitchen.. 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 Drug use-no  Risk Factors: Smoking Status: never (12/08/2006)  Past Medical History: CHEST PAIN (ICD-786.50) SHORTNESS OF BREATH (ICD-786.05) HYPERLIPIDEMIA (ICD-272.4) HYPERTENSION (ICD-401.9) GERD (ICD-530.81) BREAST HYPERTROPHY (ICD-611.1) HYPOTHYROIDISM (ICD-244.9) PREVENTIVE HEALTH CARE (ICD-V70.0) DIARRHEA (ICD-787.91) SHOULDER PAIN, RIGHT  (ICD-719.41) - right bone spur  BACK PAIN (ICD-724.5) HYPOKALEMIA (ICD-276.8) PREVENTIVE HEALTH CARE (ICD-V70.0) FAMILY HISTORY BREAST CANCER 1ST DEGREE RELATIVE <50 (ICD-V16.3) ANEMIA-NOS (ICD-285.9)   Diverticulosis, colon  Review of Systems       all otherwise negative per pt -    Physical Exam  General:  alert and overweight-appearing.   Head:  normocephalic and atraumatic.   Eyes:  vision grossly intact, pupils equal, and pupils round.   Ears:  R ear normal and L ear normal.   Nose:  no external deformity and no nasal discharge.   Mouth:  no gingival abnormalities and pharynx pink and moist.   Neck:  supple and no masses.   Lungs:  normal respiratory effort and normal breath sounds.   Heart:  normal rate and regular rhythm.   Msk:  no flank tender bilat; right foot and ankle with redness, sweling, tender or decreaesed ROM Extremities:  no edema, no erythema  Neurologic:  cranial nerves II-XII intact, strength normal in all extremities, gait normal, and DTRs symmetrical and normal.   Skin:  color normal and no rashes.  but does have prob lipoma type lesion subq at the right lateral mallealar Psych:  not depressed appearing and moderately anxious.     Impression & Recommendations:  Problem # 1:  LIPOMA (ICD-214.9)  lateral right ankle - ? increased in size ; and with vague ankle and foot pain - to refer to ortho/dr bednarz per pr request  Orders: Orthopedic Surgeon Referral (Ortho Surgeon)  Problem # 2:  ANKLE PAIN, RIGHT (ICD-719.47) as above , seems overall minor - treat as above, f/u any worsening signs or symptoms , tylenol as needed  Orders: Orthopedic Surgeon Referral (Ortho Surgeon)  Problem # 3:  FEVER UNSPECIFIED (ICD-780.60) exam benign, to check labs below; follow with expectant management  Orders: TLB-Udip w/ Micro (81001-URINE) T-Culture, Urine ([id number])  Problem # 4:  HEADACHE (ICD-784.0)  Her updated medication list for this problem  includes:    Ecotrin Low Strength 81 Mg Tbec (Aspirin) .Marland Kitchen... 1 once daily po recurrent, most c/w mild migraine - for OTC exxcedrin migraine as needed   Complete Medication List: 1)  Ecotrin Low Strength 81 Mg Tbec (Aspirin) .Marland Kitchen.. 1 once daily po 2)  Klor-con 10 10 Meq Cr-tabs (Potassium chloride) .Marland Kitchen.. 1 by mouth once daily 3)  Diovan Hct 80-12.5 Mg Tabs (Valsartan-hydrochlorothiazide) .Marland Kitchen.. 1 by mouth once daily 4)  Levothyroxine Sodium 50 Mcg Tabs (Levothyroxine sodium) .Marland Kitchen.. 1 by mouth once daily  Patient Instructions: 1)  You will be contacted about the referral(s) to: orthopedic - Dr Lestine Box 2)  You can also use Excedrin migraine OTC or it's generic for headache 3)  Please go to the Lab in the basement for your urine tests today  4)  Please call the number on the [facility] Card for results of your testing  5)  Please schedule a follow-up appointment as needed.  Orders Added: 1)  Orthopedic Surgeon Referral [Ortho Surgeon] 2)  TLB-Udip w/ Micro [81001-URINE] 3)  T-Culture, Urine [[id number]] 4)  Est. Patient Level IV [78469]

## 2010-05-24 ENCOUNTER — Encounter: Payer: Self-pay | Admitting: Internal Medicine

## 2010-07-06 ENCOUNTER — Telehealth: Payer: Self-pay | Admitting: Cardiology

## 2010-07-06 NOTE — Telephone Encounter (Signed)
Pt calling re having surgery on Tuesday, will be under anesthesia and wants to make sure this is ok

## 2010-07-06 NOTE — Telephone Encounter (Signed)
Pt is having breast reduction surgery on Tuesday with Dr. Stephens November.  She denies any chest discomfort, angina, or edema.  I will forward to Dr. Daleen Squibb and return call to pt Monday. Mylo Red RN

## 2010-07-09 ENCOUNTER — Telehealth: Payer: Self-pay | Admitting: *Deleted

## 2010-07-09 NOTE — Telephone Encounter (Signed)
Pt returned call from today regarding surgical clearance.  Please call her back.

## 2010-07-09 NOTE — Telephone Encounter (Signed)
Pt is aware per Dr. Daleen Squibb she is cleared for surgery. Mylo Red RN

## 2010-07-09 NOTE — Telephone Encounter (Signed)
Per Dr. Daleen Squibb pt is cleared for mammoplasty reduction surgery with Dr. Stephens November. Left telephone number call back as voice mail was full. Mylo Red RN

## 2010-07-10 NOTE — Assessment & Plan Note (Signed)
[facility]                            CARDIOLOGY OFFICE NOTE   NAME:[name], [name]                     MRN:          [id number]  DATE:12/30/2007                            DOB:          Oct 15, 1960    [name] is a delightful 50 year old African American female,  who comes today to establish with me as her cardiologist.  She had been  a former patient of Dr. Simona [name].   She is also followed by Dr. Oliver [name] in Primary Care.   Her original evaluation by Dr. Diona [name] was for shortness of breath and  chest pain.  She had a stress Myoview on November 01, 2004, which showed  EF of 60% with soft tissue attenuation from breast tissue.   She is having no ischemic symptoms other than just getting short of  breath when she overdoes it.  She is probably deconditioned and has a  problem with her weight.  She rarely admits this.   She denies any orthopnea, PND, or peripheral edema.   CURRENT MEDICATIONS:  1. Diovan hydrochlorothiazide 80 mg/20.5 mg per day.  2. Aspirin 81 mg a day.  3. Potassium 10 mEq a day.   PHYSICAL EXAMINATION:  Her blood pressure today is 116/77, her pulse is  76 and regular, weight is 194.  HEENT is normal.  Carotid upstrokes were  equal bilaterally without bruits, no JVD.  Thyroid is not enlarged.  Trachea is midline.  Neck is supple.  Lungs are clear to auscultation  and percussion.  Heart reveals a nondisplaced PMI.  Normal S1 and S2.  No S4.  Abdominal exam is soft.  Good bowel sounds.  No pulsatile mass.  No organomegaly.  Extremities reveal no cyanosis, clubbing, or edema.  Pulses are intact.   EKG is normal.   I spent 20+ minutes getting to know [name] [name] today, answering  questions.  I have asked her to try to walk at least 3 hours a week or  to exercise 3 hours a week.  At this point in time, I do not see any  reason for any further ischemic evaluation.   I will see her back in a year.     [name]  C. Daleen Squibb, MD, [facility]  Electronically Signed    TCW/MedQ  DD: 12/30/2007  DT: 12/31/2007  Job #: [id number]

## 2010-07-10 NOTE — Assessment & Plan Note (Signed)
[facility]                            CARDIOLOGY OFFICE NOTE   NAME:[name] [name], [name] [name]                     MRN:          [id number]  DATE:01/02/2007                            DOB:          August 27, 1960    DATE OF VISIT:  January 02, 2007.   PRIMARY CARE PHYSICIAN:  Corwin Levins, MD.   REASON FOR VISIT:  Routine followup.   HISTORY OF PRESENT ILLNESS:  I saw [name] [name] back in October of last  year.  She states that overall she has been doing fairly well.  She is  not bothered by any limiting dyspnea on exertion and has had no  significant chest pain, palpitations, orthopnea, or claudication.  She  had a recent physical with Dr. Jonny Ruiz back in October.  She is now on a  potassium supplement and otherwise continues on her  Diovan/hydrochlorothiazide with good blood pressure control.  She is not  reporting any lower extremity edema.   ALLERGIES:  No known drug allergies.   PRESENT MEDICATIONS:  1. Aspirin 81 mg p.o. daily.  2. Diovan/HCT 80/12.5 mg p.o. daily.  3. Potassium 10 mEq p.o. daily.  4. Multivitamin and calcium supplements.   REVIEW OF SYSTEMS:  As described in the History of Present Illness.  .   PHYSICAL EXAMINATION:  VITAL SIGNS:  Weight is 188 pounds, which is from  181 last year, blood pressure is 130/86, heart rate is 92.  GENERAL:  The patient is comfortable and in no acute distress.  NECK:  No elevated jugulovenous pressure, no loud bruits.  LUNGS:  Clear without labored breathing.  CARDIAC:  Regular rate and rhythm, no S3 gallop, no pericardial rub.  EXTREMITIES:  No pitting edema.   IMPRESSION AND RECOMMENDATIONS:  1. Hypertension, relatively well controlled.  Our plan will be to      continue Diovan/hydrochlorothiazide.  Agree with potassium      supplement.  I have provided her a prescription for her      antihypertensive today.  2. Otherwise, continue basic risk factor modification as per Dr. Jonny Ruiz.      She is not  experiencing any limiting dyspnea on exertion and had a      relatively low risk Myoview back in 2006.  I      have asked her to be observant for any new symptom changes, and we      will otherwise plan to see her back in one year's time.     Jonelle Sidle, MD  Electronically Signed    SGM/MedQ  DD: 01/02/2007  DT: 01/02/2007  Job #: [id number]   cc:   Corwin Levins, MD

## 2010-07-13 NOTE — Assessment & Plan Note (Signed)
[facility]                              CARDIOLOGY OFFICE NOTE   NAME:[name] [name] GEISEL                     MRN:          [id number]  DATE:12/12/2005                            DOB:          1960-03-12    PRIMARY CARE PHYSICIAN:  Corwin Levins, M.D.   REASON FOR VISIT:  Followup dyspnea and hypertension.   HISTORY OF PRESENT ILLNESS:  [name] returns for a six-month.  She  states that in general she has been left breathless with activity.  She  continues to take medicine outlined below.  Her electrocardiogram today  showed sinus rhythm with nonspecific ST changes and no marked changes  compared to the prior tracing.  She denies any problems with palpitations or  syncope.  She has been trying to work in some weight loss but recently went  on a cruise and gained some of the weight back she had lost back.  She is  working with [employer] and is more focused on getting back to her  regular walking.  She is not reporting any reproducible exertional chest  pain.   ALLERGIES:  No known drug allergies.   PRESENT MEDICATIONS:  1. Enteric-coated aspirin 81 mg p.o. daily.  2. Diovan/HCT 80/12.5 mg p.o. daily.   REVIEW OF SYSTEMS:  As described in history of present illness.   PHYSICAL EXAMINATION:  VITAL SIGNS:  Blood pressure 115/80, heart rate 71,  weight 181 pounds, down from 187 in September 2006.  GENERAL:  She is comfortable and in no acute distress.  NECK:  No elevated jugular venous pressure.  No thyromegaly noted.  No  carotid bruits.  LUNGS:  Clear without labored breathing.  CARDIAC:  Regular rate and rhythm without murmur or gallop.  EXTREMITIES:  No pitting edema.   IMPRESSION/RECOMMENDATIONS:  1. Hypertension, well controlled at this point.  2. Prior dyspnea on exertion, improved.  She had a low risk Myoview as      outlined before.  I      told her to be observant of any changes in symptoms that may prompt      additional  need for testing.  I will otherwise plan to see her back      over the next six months.       Jonelle Sidle, MD     SGM/MedQ  DD:  12/12/2005  DT:  12/14/2005  Job #:  [id number]   cc:   Corwin Levins, MD

## 2010-08-17 ENCOUNTER — Encounter: Payer: Self-pay | Admitting: Internal Medicine

## 2010-08-17 ENCOUNTER — Ambulatory Visit (INDEPENDENT_AMBULATORY_CARE_PROVIDER_SITE_OTHER): Payer: 59 | Admitting: Internal Medicine

## 2010-08-17 ENCOUNTER — Other Ambulatory Visit (INDEPENDENT_AMBULATORY_CARE_PROVIDER_SITE_OTHER): Payer: 59

## 2010-08-17 ENCOUNTER — Ambulatory Visit: Payer: 59 | Admitting: Internal Medicine

## 2010-08-17 VITALS — BP 110/72 | HR 110 | Temp 100.1°F | Ht 62.0 in | Wt 179.5 lb

## 2010-08-17 DIAGNOSIS — Z0000 Encounter for general adult medical examination without abnormal findings: Secondary | ICD-10-CM

## 2010-08-17 DIAGNOSIS — I10 Essential (primary) hypertension: Secondary | ICD-10-CM

## 2010-08-17 DIAGNOSIS — R509 Fever, unspecified: Secondary | ICD-10-CM

## 2010-08-17 DIAGNOSIS — Z0001 Encounter for general adult medical examination with abnormal findings: Secondary | ICD-10-CM | POA: Insufficient documentation

## 2010-08-17 DIAGNOSIS — E039 Hypothyroidism, unspecified: Secondary | ICD-10-CM

## 2010-08-17 LAB — CBC WITH DIFFERENTIAL/PLATELET
Basophils Relative: 0.3 % (ref 0.0–3.0)
Eosinophils Relative: 0.2 % (ref 0.0–5.0)
HCT: 31.9 % — ABNORMAL LOW (ref 36.0–46.0)
Hemoglobin: 10.4 g/dL — ABNORMAL LOW (ref 12.0–15.0)
Lymphs Abs: 1.4 10*3/uL (ref 0.7–4.0)
MCV: 79.5 fl (ref 78.0–100.0)
Monocytes Absolute: 0.7 10*3/uL (ref 0.1–1.0)
Neutro Abs: 8.3 10*3/uL — ABNORMAL HIGH (ref 1.4–7.7)
Platelets: 483 10*3/uL — ABNORMAL HIGH (ref 150.0–400.0)
RBC: 4.02 Mil/uL (ref 3.87–5.11)
WBC: 10.6 10*3/uL — ABNORMAL HIGH (ref 4.5–10.5)

## 2010-08-17 LAB — HEPATIC FUNCTION PANEL
AST: 31 U/L (ref 0–37)
Total Bilirubin: 0.2 mg/dL — ABNORMAL LOW (ref 0.3–1.2)

## 2010-08-17 LAB — BASIC METABOLIC PANEL
BUN: 13 mg/dL (ref 6–23)
CO2: 27 mEq/L (ref 19–32)
Calcium: 8.5 mg/dL (ref 8.4–10.5)
Chloride: 98 mEq/L (ref 96–112)
GFR: 118.89 mL/min (ref >60.00)

## 2010-08-17 LAB — URINALYSIS, ROUTINE W REFLEX MICROSCOPIC
Leukocytes, UA: NEGATIVE
Nitrite: NEGATIVE
Total Protein, Urine: NEGATIVE
Urobilinogen, UA: 0.2 (ref 0.0–1.0)

## 2010-08-17 LAB — SEDIMENTATION RATE: Sed Rate: 57 mm/hr — ABNORMAL HIGH (ref 0–22)

## 2010-08-17 NOTE — Patient Instructions (Signed)
Continue all other medications as before Please go to LAB in the Basement for the blood and/or urine tests to be done today Please go to XRAY in the Basement for the x-ray test Please call the phone number [phone] (the PhoneTree System) for results of testing in 2-3 days;  When calling, simply dial the number, and when prompted enter the MRN number above (the Medical Record Number) and the # key, then the message should start. Please return in 3 mo with Lab testing done 3-5 days before

## 2010-08-17 NOTE — Progress Notes (Signed)
Subjective:    Patient ID: [name] [name], female    DOB: 1960/03/23, 50 y.o.   MRN: [id number]  HPI Here with febrile illness for 2 wks , without obvious source.  Is s/p breast reduction surgury may 15, now s/p 2 wks intermittent ill feeling with low grade temp, chills, sweats, nausea, HA without other such as ST, cough, abd pain, vomiting, diarrhea.  Pt denies chest pain, increased sob or doe, wheezing, orthopnea, PND, increased LE swelling, palpitations, dizziness or syncope.  Pt denies new neurological symptoms such as facial or extremity weakness or numbness. No back pain.   Pt denies polydipsia, polyuria.  Saw Dr Stephens November approx 1 wks ago and reassured no signifcant infection related to breast surgury.  Has lost 6 lbs due to decreased appetitie. Denies hyper or hypo thyroid symptoms such as voice, skin or hair change.  Past Medical History  Diagnosis Date  . ANEMIA-NOS 12/08/2006  . ANKLE PAIN, RIGHT 04/18/2010  . BACK PAIN 12/08/2006  . BREAST HYPERTROPHY 02/05/2008  . CHEST PAIN 01/12/2009  . Diarrhea 03/09/2007  . DIVERTICULOSIS, COLON 04/18/2010  . FEVER UNSPECIFIED 04/18/2010  . GERD 12/08/2006  . Headache 04/18/2010  . HYPERLIPIDEMIA 12/08/2006  . HYPERTENSION 12/08/2006  . HYPOKALEMIA 12/08/2006  . HYPOTHYROIDISM 02/05/2008  . LIPOMA 04/18/2010  . OVERWEIGHT/OBESITY 01/11/2010  . Shortness of breath 01/12/2009  . SHOULDER PAIN, RIGHT 03/09/2007  . URINALYSIS, ABNORMAL 11/23/2009  . URI 11/23/2009   No past surgical history on file.  reports that she has never smoked. She does not have any smokeless tobacco history on file. She reports that she drinks alcohol. She reports that she does not use illicit drugs. family history includes Cancer in her cousin and other; Diabetes in her mother; Hypertension in her brother, mother, and sister; and Stroke in her mother. Allergies  Allergen Reactions  . Propoxyphene Hcl     REACTION: nausea   Current Outpatient Prescriptions on File  Prior to Visit  Medication Sig Dispense Refill  . levothyroxine (SYNTHROID, LEVOTHROID) 50 MCG tablet Take 50 mcg by mouth daily.        . potassium chloride (KLOR-CON) 10 MEQ CR tablet Take 10 mEq by mouth daily.        . valsartan-hydrochlorothiazide (DIOVAN-HCT) 80-12.5 MG per tablet Take 1 tablet by mouth daily.        Marland Kitchen aspirin 81 MG EC tablet Take 81 mg by mouth daily.         Review of Systems Review of Systems  Constitutional: Negative for diaphoresis and unexpected weight change.  HENT: Negative for drooling and tinnitus.   Eyes: Negative for photophobia and visual disturbance.  Respiratory: Negative for choking and stridor.   Gastrointestinal: Negative for vomiting and blood in stool.  Genitourinary: Negative for hematuria and decreased urine volume.  Musculoskeletal: Negative for gait problem.  Skin: Negative for color change and wound.  Neurological: Negative for tremors and numbness.  Psychiatric/Behavioral: Negative for decreased concentration. The patient is not hyperactive.       Objective:   Physical Exam BP 110/72  Pulse 110  Temp(Src) 100.1 F (37.8 C) (Oral)  Ht 5\' 2"  (1.575 m)  Wt 179 lb 8 oz (81.421 kg)  BMI 32.83 kg/m2  SpO2 97%  LMP 07/27/2010 Physical Exam  VS noted Constitutional: Pt is oriented to person, place, and time. Appears well-developed and well-nourished.  Head: Normocephalic and atraumatic.  Right Ear: External ear normal.  Left Ear: External ear normal.  Nose: Nose normal.  Bilat TM's negative for erythema Mouth/Throat: Oropharynx is clear and moist.  Eyes: Conjunctivae and EOM are normal. Pupils are equal, round, and reactive to light.  Neck: Normal range of motion. Neck supple. No JVD present. No tracheal deviation present.  Cardiovascular: Normal rate, regular rhythm, normal heart sounds and intact distal pulses.   Pulmonary/Chest: Effort normal and breath sounds normal.  Abdominal: Soft. Bowel sounds are normal. There is no  tenderness.  Musculoskeletal: Normal range of motion. Exhibits no edema.  Lymphadenopathy:  Has no cervical adenopathy.  Neurological: Pt is alert and oriented to person, place, and time. Pt has normal reflexes. No cranial nerve deficit.  Skin: Skin is warm and dry. No rash noted.  Psychiatric:  Has  normal mood and affect. Behavior is normal. 1+ nervous        Assessment & Plan:

## 2010-08-17 NOTE — Assessment & Plan Note (Signed)
stable overall by hx and exam, most recent data reviewed with pt, and pt to continue medical treatment as before  Lab Results  Component Value Date   TSH 2.73 11/22/2009

## 2010-08-17 NOTE — Assessment & Plan Note (Signed)
?   Etiology, suspect may be viral realated, exam essentially benign, but will check labs, cxr, UA, and blood cx,  to f/u any worsening symptoms or concerns

## 2010-08-17 NOTE — Assessment & Plan Note (Signed)
stable overall by hx and exam, most recent data reviewed with pt, and pt to continue medical treatment as before  BP Readings from Last 3 Encounters:  08/17/10 110/72  04/18/10 100/70  01/11/10 106/68

## 2010-08-20 ENCOUNTER — Ambulatory Visit (INDEPENDENT_AMBULATORY_CARE_PROVIDER_SITE_OTHER)
Admission: RE | Admit: 2010-08-20 | Discharge: 2010-08-20 | Disposition: A | Discharge status: Home / Self Care | Payer: 59 | Source: Ambulatory Visit | Attending: Internal Medicine | Admitting: Internal Medicine

## 2010-08-20 DIAGNOSIS — R509 Fever, unspecified: Secondary | ICD-10-CM

## 2010-08-20 NOTE — Progress Notes (Signed)
Quick Note:  Voice message left on PhoneTree system - lab is negative, normal or otherwise stable, pt to continue same tx ______ 

## 2010-08-31 ENCOUNTER — Other Ambulatory Visit: Payer: Self-pay | Admitting: Internal Medicine

## 2010-12-03 ENCOUNTER — Other Ambulatory Visit: Payer: Self-pay | Admitting: Internal Medicine

## 2010-12-17 ENCOUNTER — Telehealth: Payer: Self-pay

## 2010-12-17 DIAGNOSIS — Z0000 Encounter for general adult medical examination without abnormal findings: Secondary | ICD-10-CM

## 2010-12-17 NOTE — Telephone Encounter (Signed)
Put order in for physical labs. 

## 2010-12-29 ENCOUNTER — Other Ambulatory Visit: Payer: Self-pay | Admitting: Internal Medicine

## 2011-01-08 ENCOUNTER — Ambulatory Visit: Payer: 59 | Admitting: Cardiology

## 2011-01-29 ENCOUNTER — Other Ambulatory Visit (INDEPENDENT_AMBULATORY_CARE_PROVIDER_SITE_OTHER): Payer: 59

## 2011-01-29 ENCOUNTER — Encounter: Payer: Self-pay | Admitting: Internal Medicine

## 2011-01-29 ENCOUNTER — Ambulatory Visit (INDEPENDENT_AMBULATORY_CARE_PROVIDER_SITE_OTHER)
Admission: RE | Admit: 2011-01-29 | Discharge: 2011-01-29 | Disposition: A | Discharge status: Home / Self Care | Payer: 59 | Source: Ambulatory Visit | Attending: Internal Medicine | Admitting: Internal Medicine

## 2011-01-29 ENCOUNTER — Ambulatory Visit (INDEPENDENT_AMBULATORY_CARE_PROVIDER_SITE_OTHER): Payer: 59 | Admitting: Internal Medicine

## 2011-01-29 VITALS — BP 110/68 | HR 91 | Temp 98.9°F | Ht 62.0 in | Wt 184.1 lb

## 2011-01-29 DIAGNOSIS — E876 Hypokalemia: Secondary | ICD-10-CM

## 2011-01-29 DIAGNOSIS — Z0000 Encounter for general adult medical examination without abnormal findings: Secondary | ICD-10-CM

## 2011-01-29 DIAGNOSIS — G459 Transient cerebral ischemic attack, unspecified: Secondary | ICD-10-CM

## 2011-01-29 DIAGNOSIS — I10 Essential (primary) hypertension: Secondary | ICD-10-CM

## 2011-01-29 DIAGNOSIS — E785 Hyperlipidemia, unspecified: Secondary | ICD-10-CM

## 2011-01-29 DIAGNOSIS — Z23 Encounter for immunization: Secondary | ICD-10-CM

## 2011-01-29 LAB — CBC WITH DIFFERENTIAL/PLATELET
Basophils Absolute: 0 10*3/uL (ref 0.0–0.1)
Basophils Relative: 0.5 % (ref 0.0–3.0)
Eosinophils Absolute: 0.1 10*3/uL (ref 0.0–0.7)
Lymphocytes Relative: 33.8 % (ref 12.0–46.0)
MCHC: 32.3 g/dL (ref 30.0–36.0)
MCV: 77.6 fl — ABNORMAL LOW (ref 78.0–100.0)
Monocytes Absolute: 0.5 10*3/uL (ref 0.1–1.0)
Neutrophils Relative %: 56.1 % (ref 43.0–77.0)
Platelets: 404 10*3/uL — ABNORMAL HIGH (ref 150.0–400.0)
RDW: 18 % — ABNORMAL HIGH (ref 11.5–14.6)

## 2011-01-29 LAB — URINALYSIS, ROUTINE W REFLEX MICROSCOPIC
Leukocytes, UA: NEGATIVE
Nitrite: POSITIVE
Specific Gravity, Urine: >=1.03 (ref 1.000–1.030)
Total Protein, Urine: 100
pH: 6 (ref 5.0–8.0)

## 2011-01-29 LAB — BASIC METABOLIC PANEL
BUN: 14 mg/dL (ref 6–23)
Chloride: 104 mEq/L (ref 96–112)
GFR: 99.59 mL/min (ref >60.00)
Potassium: 3 mEq/L — ABNORMAL LOW (ref 3.5–5.1)
Sodium: 141 mEq/L (ref 135–145)

## 2011-01-29 LAB — TSH: TSH: 2.53 u[IU]/mL (ref 0.35–5.50)

## 2011-01-29 LAB — LIPID PANEL
LDL Cholesterol: 101 mg/dL — ABNORMAL HIGH (ref 0–99)
Total CHOL/HDL Ratio: 3

## 2011-01-29 LAB — HEPATIC FUNCTION PANEL
AST: 20 U/L (ref 0–37)
Bilirubin, Direct: 0 mg/dL (ref 0.0–0.3)
Total Bilirubin: 0.3 mg/dL (ref 0.3–1.2)

## 2011-01-29 MED ORDER — POTASSIUM CHLORIDE 10 MEQ PO TBCR: 20.0000 meq | EXTENDED_RELEASE_TABLET | Freq: Every day | ORAL | Status: DC

## 2011-01-29 MED ORDER — ASPIRIN EC 325 MG PO TBEC: 325.0000 mg | DELAYED_RELEASE_TABLET | Freq: Every day | ORAL | Status: AC

## 2011-01-29 NOTE — Patient Instructions (Addendum)
Your blood work from this morning is still pending results Please go to XRAY in the Basement for the x-ray test Please call the phone number [phone] (the PhoneTree System) for results of testing in 2-3 days;  When calling, simply dial the number, and when prompted enter the MRN number above (the Medical Record Number) and the # key, then the message should start. You had the flu shot today Please increase the Aspirin to 325 mg per day You will be contacted regarding the referral for: MRI for the head, carotid artery test, and echocardiogram Please return in 6 month, or sooner if needed

## 2011-01-29 NOTE — Progress Notes (Signed)
Subjective:    Patient ID: [name] [name], female    DOB: [birth year], 50 y.o.   MRN: [id number]  HPI  Here for wellness and f/u;  Overall doing ok;  Pt denies CP, worsening SOB, DOE, wheezing, orthopnea, PND, worsening LE edema, palpitations, dizziness or syncope.  Pt denies neurological change such as new Headache, facial or extremity weakness.  Pt denies polydipsia, polyuria, or low sugar symptoms. Pt states overall good compliance with treatment and medications, good tolerability, and trying to follow lower cholesterol diet.  Pt denies worsening depressive symptoms, suicidal ideation or panic. No fever, wt loss, night sweats, loss of appetite, or other constitutional symptoms.  Pt states good ability with ADL's, low fall risk, home safety reviewed and adequate, no significant changes in hearing or vision, and occasionally active with exercise. S/p bilat breast reduction complicated by staph infection.  Did also have a "strange feeling" last wk as she was lying on right side in bed, only lasted about 1 min, but characterized but new onset inability to speak/come up with words and RUE weakness/somewhat tremulous.  Scared her, but no pain or HA and has not recurred, so waited to be seen until today.   Also Mentions only taking one K pill per day, seems to be reluctant to take 2.  Due for flu shot.  Does also have other stable migraine type headaches no change. Past Medical History  Diagnosis Date  . ANEMIA-NOS 12/08/2006  . ANKLE PAIN, RIGHT 04/18/2010  . BACK PAIN 12/08/2006  . BREAST HYPERTROPHY 02/05/2008  . CHEST PAIN 01/12/2009  . Diarrhea 03/09/2007  . DIVERTICULOSIS, COLON 04/18/2010  . FEVER UNSPECIFIED 04/18/2010  . GERD 12/08/2006  . Headache 04/18/2010  . HYPERLIPIDEMIA 12/08/2006  . HYPERTENSION 12/08/2006  . HYPOKALEMIA 12/08/2006  . HYPOTHYROIDISM 02/05/2008  . LIPOMA 04/18/2010  . OVERWEIGHT/OBESITY 01/11/2010  . Shortness of breath 01/12/2009  . SHOULDER PAIN, RIGHT 03/09/2007  .  URINALYSIS, ABNORMAL 11/23/2009  . URI 11/23/2009   No past surgical history on file.  reports that she has never smoked. She does not have any smokeless tobacco history on file. She reports that she drinks alcohol. She reports that she does not use illicit drugs. family history includes Cancer in her cousin and other; Diabetes in her mother; Hypertension in her brother, mother, and sister; and Stroke in her mother. Allergies  Allergen Reactions  . Propoxyphene Hcl     REACTION: nausea   Current Outpatient Prescriptions on File Prior to Visit  Medication Sig Dispense Refill  . DIOVAN HCT 80-12.5 MG per tablet TAKE ONE TABLET BY MOUTH EVERY DAY  90 each  1  . levothyroxine (SYNTHROID, LEVOTHROID) 50 MCG tablet TAKE ONE TABLET BY MOUTH EVERY DAY  90 tablet  1   Review of Systems Review of Systems  Constitutional: Negative for diaphoresis, activity change, appetite change and unexpected weight change.  HENT: Negative for hearing loss, ear pain, facial swelling, mouth sores and neck stiffness.   Eyes: Negative for pain, redness and visual disturbance.  Respiratory: Negative for shortness of breath and wheezing.   Cardiovascular: Negative for chest pain and palpitations.  Gastrointestinal: Negative for diarrhea, blood in stool, abdominal distention and rectal pain.  Genitourinary: Negative for hematuria, flank pain and decreased urine volume.  Musculoskeletal: Negative for myalgias and joint swelling.  Skin: Negative for color change and wound.  Neurological: Negative for syncope and numbness.  Hematological: Negative for adenopathy.  Psychiatric/Behavioral: Negative for hallucinations, self-injury, decreased concentration and agitation.  Objective:   Physical Exam BP 110/68  Pulse 91  Temp(Src) 98.9 F (37.2 C) (Oral)  Ht 5\' 2"  (1.575 m)  Wt 184 lb 2 oz (83.519 kg)  BMI 33.68 kg/m2  SpO2 99% Physical Exam  VS noted Constitutional: Pt is oriented to person, place, and time.  Appears well-developed and well-nourished.  HENT:  Head: Normocephalic and atraumatic.  Right Ear: External ear normal.  Left Ear: External ear normal.  Nose: Nose normal.  Mouth/Throat: Oropharynx is clear and moist.  Eyes: Conjunctivae and EOM are normal. Pupils are equal, round, and reactive to light.  Neck: Normal range of motion. Neck supple. No JVD present. No tracheal deviation present.  Cardiovascular: Normal rate, regular rhythm, normal heart sounds and intact distal pulses.   Pulmonary/Chest: Effort normal and breath sounds normal.  Abdominal: Soft. Bowel sounds are normal. There is no tenderness.  Musculoskeletal: Normal range of motion. Exhibits no edema.  Lymphadenopathy:  Has no cervical adenopathy.  Neurological: Pt is alert and oriented to person, place, and time. Pt has normal reflexes. No cranial nerve deficit. Motor/sens/dtr intact Skin: Skin is warm and dry. No rash noted.  Psychiatric:  Has  normal mood and affect. Behavior is normal. 1-2+ nervous    Assessment & Plan:

## 2011-01-29 NOTE — Assessment & Plan Note (Signed)

## 2011-01-29 NOTE — Assessment & Plan Note (Addendum)
Urged pt compliance with taking 2 K supp med per day as prescribed, for lab f/u  Lab Results  Component Value Date   K 3.0* 01/29/2011

## 2011-01-29 NOTE — Assessment & Plan Note (Signed)
stable overall by hx and exam, most recent data reviewed with pt, and pt to continue medical treatment as before  BP Readings from Last 3 Encounters:  01/29/11 110/68  08/17/10 110/72  04/18/10 100/70

## 2011-01-29 NOTE — Assessment & Plan Note (Signed)
stable overall by hx and exam, most recent data reviewed with pt, and pt to continue medical treatment as before  Lab Results  Component Value Date   LDLCALC 101* 01/29/2011

## 2011-01-29 NOTE — Assessment & Plan Note (Signed)
With neuro symptom very mild/transient but different from migraine; non recurred, for increased asa to 325 mg; for MRI/echo/carotids,  to f/u any worsening symptoms or concerns, declines neuro eval at this time

## 2011-01-30 LAB — FOLATE: Folate: 12.1 ng/mL (ref >5.9)

## 2011-01-30 LAB — IBC PANEL
Iron: 40 ug/dL — ABNORMAL LOW (ref 42–145)
Transferrin: 353.6 mg/dL (ref 212.0–360.0)

## 2011-02-02 ENCOUNTER — Ambulatory Visit
Admission: RE | Admit: 2011-02-02 | Discharge: 2011-02-02 | Disposition: A | Discharge status: Home / Self Care | Payer: 59 | Source: Ambulatory Visit | Attending: Internal Medicine | Admitting: Internal Medicine

## 2011-02-02 DIAGNOSIS — G459 Transient cerebral ischemic attack, unspecified: Secondary | ICD-10-CM

## 2011-02-05 ENCOUNTER — Other Ambulatory Visit (HOSPITAL_COMMUNITY): Payer: 59

## 2011-02-06 ENCOUNTER — Ambulatory Visit (HOSPITAL_COMMUNITY): Payer: 59 | Attending: Internal Medicine

## 2011-02-06 DIAGNOSIS — R079 Chest pain, unspecified: Secondary | ICD-10-CM | POA: Insufficient documentation

## 2011-02-06 DIAGNOSIS — I10 Essential (primary) hypertension: Secondary | ICD-10-CM | POA: Insufficient documentation

## 2011-02-06 DIAGNOSIS — G459 Transient cerebral ischemic attack, unspecified: Secondary | ICD-10-CM | POA: Insufficient documentation

## 2011-02-06 DIAGNOSIS — I059 Rheumatic mitral valve disease, unspecified: Secondary | ICD-10-CM | POA: Insufficient documentation

## 2011-02-06 DIAGNOSIS — E785 Hyperlipidemia, unspecified: Secondary | ICD-10-CM | POA: Insufficient documentation

## 2011-02-06 DIAGNOSIS — E039 Hypothyroidism, unspecified: Secondary | ICD-10-CM | POA: Insufficient documentation

## 2011-02-07 ENCOUNTER — Encounter: Payer: Self-pay | Admitting: Internal Medicine

## 2011-02-07 ENCOUNTER — Ambulatory Visit (INDEPENDENT_AMBULATORY_CARE_PROVIDER_SITE_OTHER): Payer: 59 | Admitting: Cardiology

## 2011-02-07 DIAGNOSIS — G459 Transient cerebral ischemic attack, unspecified: Secondary | ICD-10-CM

## 2011-02-07 DIAGNOSIS — E876 Hypokalemia: Secondary | ICD-10-CM

## 2011-02-07 DIAGNOSIS — I340 Nonrheumatic mitral (valve) insufficiency: Secondary | ICD-10-CM | POA: Insufficient documentation

## 2011-02-07 DIAGNOSIS — I10 Essential (primary) hypertension: Secondary | ICD-10-CM

## 2011-02-07 DIAGNOSIS — I059 Rheumatic mitral valve disease, unspecified: Secondary | ICD-10-CM

## 2011-02-07 HISTORY — DX: Nonrheumatic mitral (valve) insufficiency: I34.0

## 2011-02-07 NOTE — Progress Notes (Signed)
HPI [name] [name] comes in today for evaluation of possible TIA.  She was lying down in room with 2 other people in the room. She suddenly could hear the conversation but felt like she was in a bubble. It almost felt like it was a dream and she could not respond. It only lasted for about a minute. Her right arm was moving up and down but there were no other neurological symptoms. Specifically,, there was no loss of vision, double vision, slurred speech, numbness or weakness in any extremities. She has not had any unusual headaches. Her hearing is intact. Balance is normal.  She's never had a history of any specific cardiology problem. Echocardiogram ordered by Dr. Jonny Ruiz shows normal left ventricular chamber size and function, mild mitral regurgitation with a structurally normal mitral valve, no left atrial enlargement, no obvious clot, and normal pressures.  She has had no history of palpitations, chest pain, presyncope or syncope.There has been no head trauma.  She occasionally has a headache but this is pretty unusual.  Past Medical History  Diagnosis Date  . ANEMIA-NOS 12/08/2006  . ANKLE PAIN, RIGHT 04/18/2010  . BACK PAIN 12/08/2006  . BREAST HYPERTROPHY 02/05/2008  . CHEST PAIN 01/12/2009  . Diarrhea 03/09/2007  . DIVERTICULOSIS, COLON 04/18/2010  . FEVER UNSPECIFIED 04/18/2010  . GERD 12/08/2006  . Headache 04/18/2010  . HYPERLIPIDEMIA 12/08/2006  . HYPERTENSION 12/08/2006  . HYPOKALEMIA 12/08/2006  . HYPOTHYROIDISM 02/05/2008  . LIPOMA 04/18/2010  . OVERWEIGHT/OBESITY 01/11/2010  . Shortness of breath 01/12/2009  . SHOULDER PAIN, RIGHT 03/09/2007  . URINALYSIS, ABNORMAL 11/23/2009  . URI 11/23/2009  . Mild mitral regurgitation by prior echocardiogram 02/07/2011    Current Outpatient Prescriptions  Medication Sig Dispense Refill  . aspirin EC 325 MG tablet Take 1 tablet (325 mg total) by mouth daily.  100 tablet  3  . DIOVAN HCT 80-12.5 MG per tablet TAKE ONE TABLET BY MOUTH EVERY  DAY  90 each  1  . levothyroxine (SYNTHROID, LEVOTHROID) 50 MCG tablet TAKE ONE TABLET BY MOUTH EVERY DAY  90 tablet  1  . potassium chloride (KLOR-CON 10) 10 MEQ CR tablet Take 2 tablets (20 mEq total) by mouth daily.  180 tablet  3    Allergies  Allergen Reactions  . Propoxyphene Hcl     REACTION: nausea    Family History  Problem Relation Age of Onset  . Diabetes Mother   . Stroke Mother   . Hypertension Mother   . Hypertension Sister   . Hypertension Brother   . Cancer Cousin     breast cancer  . Cancer Other     breast cancer    History   Social History  . Marital Status: Married    Spouse Name: N/A    Number of Children: N/A  . Years of Education: N/A   Occupational History  . 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 Smoking status: Never Smoker   . Smokeless tobacco: Not on file  . Alcohol Use: Yes  . Drug Use: No  . Sexually Active: Not on file   Other Topics Concern  . Not on file   Social History Narrative  . No narrative on file    ROS ALL NEGATIVE EXCEPT THOSE NOTED IN HPI  PE  General Appearance: well developed, well nourished in no acute distress HEENT: symmetrical face, PERRLA, good dentition  Neck: no JVD, thyromegaly, or adenopathy, trachea midline  Chest: symmetric without deformity Cardiac: PMI non-displaced, RRR, normal S1, S2, 1/6 systolic murmur at the apex Lung: clear to ausculation and percussion Vascular: all pulses full without bruits , specifically no carotid bruits Abdominal: nondistended, nontender, good bowel sounds, no HSM, no bruits Extremities: no cyanosis, clubbing or edema, no sign of DVT, no varicosities  Skin: normal color, no rashes Neuro: alert and oriented x 3, non-focal Pysch: normal affect  EKG EKG from Dr. Raphael Gibney office reviewed from December 4. Normal sinus rhythm nonspecific ST segment changes, no obvious LVH BMET    Component Value Date/Time   NA 141 01/29/2011  0734   K 3.0* 01/29/2011 0734   CL 104 01/29/2011 0734   CO2 28 01/29/2011 0734   GLUCOSE 94 01/29/2011 0734   GLUCOSE 96 02/20/2006 1102   BUN 14 01/29/2011 0734   CREATININE 0.8 01/29/2011 0734   CALCIUM 8.6 01/29/2011 0734   GFRNONAA 97.17 11/22/2009 0934   GFRAA 114 02/03/2008 1119    Lipid Panel     Component Value Date/Time   CHOL 175 01/29/2011 0734   TRIG 74.0 01/29/2011 0734   HDL 59.10 01/29/2011 0734   CHOLHDL 3 01/29/2011 0734   VLDL 14.8 01/29/2011 0734   LDLCALC 101* 01/29/2011 0734    CBC    Component Value Date/Time   WBC 6.6 01/29/2011 0734   RBC 4.60 01/29/2011 0734   HGB 11.5* 01/29/2011 0734   HCT 35.7* 01/29/2011 0734   PLT 404.0* 01/29/2011 0734   MCV 77.6* 01/29/2011 0734   MCHC 32.3 01/29/2011 0734   RDW 18.0* 01/29/2011 0734   LYMPHSABS 2.2 01/29/2011 0734   MONOABS 0.5 01/29/2011 0734   EOSABS 0.1 01/29/2011 0734   BASOSABS 0.0 01/29/2011 0734

## 2011-02-07 NOTE — Patient Instructions (Signed)
Your physician recommends that you continue on your current medications as directed. Please refer to the Current Medication list given to you today.  Your physician wants you to follow-up in: 2 years with Dr. Daleen Squibb. You will receive a reminder letter in the mail two months in advance. If you don't receive a letter, please call our office to schedule the follow-up appointment.  Your physician has requested that you have an echocardiogram. Echocardiography is a painless test that uses sound waves to create images of your heart. It provides your doctor with information about the size and shape of your heart and how well your heart's chambers and valves are working. This procedure takes approximately one hour. There are no restrictions for this procedure. IN 2 YEARS 2014

## 2011-02-07 NOTE — Assessment & Plan Note (Signed)
By history, I did not think this was a TIA. I have told her that if it occurs again to have Dr. Jonny Ruiz evaluate her for perhaps a partial complex seizure disorder. If she begins to have headaches or persistent neurological signs, she may need a CT of the head. I suspect this will not be a progressive problem. I have canceled her carotid Dopplers.

## 2011-02-07 NOTE — Assessment & Plan Note (Signed)
She is mild mitral regurgitation by echocardiography. LV function is normal left atrial size is normal. There is no evidence of pulmonary hypertension. We will repeat the echocardiogram in 2 years. It is probably due to systemic hypertension. Good blood pressure control reinforced.

## 2011-02-25 ENCOUNTER — Encounter: Payer: 59 | Admitting: Cardiology

## 2011-07-09 ENCOUNTER — Other Ambulatory Visit: Payer: Self-pay | Admitting: Internal Medicine

## 2011-09-03 ENCOUNTER — Other Ambulatory Visit: Payer: Self-pay

## 2011-09-03 MED ORDER — VALSARTAN-HYDROCHLOROTHIAZIDE 80-12.5 MG PO TABS: 1.0000 | ORAL_TABLET | Freq: Every day | ORAL | Status: DC

## 2011-11-13 LAB — HM MAMMOGRAPHY

## 2011-11-28 ENCOUNTER — Telehealth: Payer: Self-pay

## 2011-11-28 DIAGNOSIS — Z0000 Encounter for general adult medical examination without abnormal findings: Secondary | ICD-10-CM

## 2011-11-28 NOTE — Telephone Encounter (Signed)
Put order in for physical lab.

## 2012-01-27 ENCOUNTER — Other Ambulatory Visit (INDEPENDENT_AMBULATORY_CARE_PROVIDER_SITE_OTHER): Payer: 59

## 2012-01-27 DIAGNOSIS — Z0000 Encounter for general adult medical examination without abnormal findings: Secondary | ICD-10-CM

## 2012-01-27 LAB — BASIC METABOLIC PANEL
CO2: 26 mEq/L (ref 19–32)
Chloride: 108 mEq/L (ref 96–112)
Sodium: 141 mEq/L (ref 135–145)

## 2012-01-27 LAB — URINALYSIS, ROUTINE W REFLEX MICROSCOPIC
Nitrite: NEGATIVE
Total Protein, Urine: NEGATIVE
Urine Glucose: NEGATIVE
pH: 6 (ref 5.0–8.0)

## 2012-01-27 LAB — CBC WITH DIFFERENTIAL/PLATELET
Basophils Absolute: 0 10*3/uL (ref 0.0–0.1)
Eosinophils Relative: 5 % (ref 0.0–5.0)
Hemoglobin: 11.8 g/dL — ABNORMAL LOW (ref 12.0–15.0)
Lymphocytes Relative: 37.8 % (ref 12.0–46.0)
Monocytes Relative: 9.1 % (ref 3.0–12.0)
Neutro Abs: 4 10*3/uL (ref 1.4–7.7)
Platelets: 349 10*3/uL (ref 150.0–400.0)
RDW: 17.4 % — ABNORMAL HIGH (ref 11.5–14.6)
WBC: 8.3 10*3/uL (ref 4.5–10.5)

## 2012-01-27 LAB — HEPATIC FUNCTION PANEL
ALT: 30 U/L (ref 0–35)
Alkaline Phosphatase: 109 U/L (ref 39–117)
Bilirubin, Direct: 0.1 mg/dL (ref 0.0–0.3)
Total Protein: 6.9 g/dL (ref 6.0–8.3)

## 2012-01-27 LAB — LIPID PANEL
Cholesterol: 179 mg/dL (ref 0–200)
HDL: 45.9 mg/dL (ref >39.00)

## 2012-01-27 LAB — TSH: TSH: 5.13 u[IU]/mL (ref 0.35–5.50)

## 2012-01-30 ENCOUNTER — Encounter: Payer: Self-pay | Admitting: Internal Medicine

## 2012-01-30 ENCOUNTER — Ambulatory Visit (INDEPENDENT_AMBULATORY_CARE_PROVIDER_SITE_OTHER): Payer: 59 | Admitting: Internal Medicine

## 2012-01-30 VITALS — BP 120/88 | HR 77 | Temp 97.8°F | Ht 62.0 in | Wt 198.5 lb

## 2012-01-30 DIAGNOSIS — E785 Hyperlipidemia, unspecified: Secondary | ICD-10-CM

## 2012-01-30 DIAGNOSIS — Z0000 Encounter for general adult medical examination without abnormal findings: Secondary | ICD-10-CM

## 2012-01-30 MED ORDER — ASPIRIN 81 MG PO TBEC
81.0000 mg | DELAYED_RELEASE_TABLET | Freq: Every day | ORAL | Status: AC
Start: 2012-01-30 — End: ?

## 2012-01-30 MED ORDER — ATORVASTATIN CALCIUM 10 MG PO TABS: 10.0000 mg | ORAL_TABLET | Freq: Every day | ORAL | Status: DC

## 2012-01-30 MED ORDER — POTASSIUM CHLORIDE ER 10 MEQ PO TBCR: 10.0000 meq | EXTENDED_RELEASE_TABLET | Freq: Every day | ORAL | Status: DC

## 2012-01-30 MED ORDER — VALSARTAN-HYDROCHLOROTHIAZIDE 80-12.5 MG PO TABS: 1.0000 | ORAL_TABLET | Freq: Every day | ORAL | Status: DC

## 2012-01-30 MED ORDER — LEVOTHYROXINE SODIUM 50 MCG PO TABS: 50.0000 ug | ORAL_TABLET | Freq: Every day | ORAL | Status: DC

## 2012-01-30 NOTE — Assessment & Plan Note (Signed)

## 2012-01-30 NOTE — Assessment & Plan Note (Signed)
To start lipitor 10 qd, cont diet

## 2012-01-30 NOTE — Progress Notes (Signed)
Subjective:    Patient ID: [name] [name], female    DOB: 1960/08/11, 51 y.o.   MRN: [id number]  HPI Here for wellness and f/u;  Overall doing ok;  Pt denies CP, worsening SOB, DOE, wheezing, orthopnea, PND, worsening LE edema, palpitations, dizziness or syncope.  Pt denies neurological change such as new Headache, facial or extremity weakness.  Pt denies polydipsia, polyuria, or low sugar symptoms. Pt states overall good compliance with treatment and medications, good tolerability, and trying to follow lower cholesterol diet.  Pt denies worsening depressive symptoms, suicidal ideation or panic. No fever, wt loss, night sweats, loss of appetite, or other constitutional symptoms.  Pt states good ability with ADL's, low fall risk, home safety reviewed and adequate, no significant changes in hearing or vision, and occasionally active with exercise.  Overall good compliance with treatment, and good medicine tolerability  No acute complaints.  Past Medical History  Diagnosis Date  . ANEMIA-NOS 12/08/2006  . ANKLE PAIN, RIGHT 04/18/2010  . BACK PAIN 12/08/2006  . BREAST HYPERTROPHY 02/05/2008  . CHEST PAIN 01/12/2009  . Diarrhea 03/09/2007  . DIVERTICULOSIS, COLON 04/18/2010  . FEVER UNSPECIFIED 04/18/2010  . GERD 12/08/2006  . Headache 04/18/2010  . HYPERLIPIDEMIA 12/08/2006  . HYPERTENSION 12/08/2006  . HYPOKALEMIA 12/08/2006  . HYPOTHYROIDISM 02/05/2008  . LIPOMA 04/18/2010  . OVERWEIGHT/OBESITY 01/11/2010  . Shortness of breath 01/12/2009  . SHOULDER PAIN, RIGHT 03/09/2007  . URINALYSIS, ABNORMAL 11/23/2009  . URI 11/23/2009  . Mild mitral regurgitation by prior echocardiogram 02/07/2011   No past surgical history on file.  reports that she has never smoked. She does not have any smokeless tobacco history on file. She reports that she drinks alcohol. She reports that she does not use illicit drugs. family history includes Cancer in her cousin and other; Diabetes in her mother; Hypertension in  her brother, mother, and sister; and Stroke in her mother. Allergies  Allergen Reactions  . Propoxyphene Hcl     REACTION: nausea   Current Outpatient Prescriptions on File Prior to Visit  Medication Sig Dispense Refill  . [DISCONTINUED] levothyroxine (SYNTHROID, LEVOTHROID) 50 MCG tablet TAKE ONE TABLET BY MOUTH EVERY DAY  90 tablet  3  . [DISCONTINUED] valsartan-hydrochlorothiazide (DIOVAN HCT) 80-12.5 MG per tablet Take 1 tablet by mouth daily.  90 tablet  1  . atorvastatin (LIPITOR) 10 MG tablet Take 1 tablet (10 mg total) by mouth daily.  90 tablet  3   Review of Systems Review of Systems  Constitutional: Negative for diaphoresis, activity change, appetite change and unexpected weight change.  HENT: Negative for hearing loss, ear pain, facial swelling, mouth sores and neck stiffness.   Eyes: Negative for pain, redness and visual disturbance.  Respiratory: Negative for shortness of breath and wheezing.   Cardiovascular: Negative for chest pain and palpitations.  Gastrointestinal: Negative for diarrhea, blood in stool, abdominal distention and rectal pain.  Genitourinary: Negative for hematuria, flank pain and decreased urine volume.  Musculoskeletal: Negative for myalgias and joint swelling.  Skin: Negative for color change and wound.  Neurological: Negative for syncope and numbness.  Hematological: Negative for adenopathy.  Psychiatric/Behavioral: Negative for hallucinations, self-injury, decreased concentration and agitation.      Objective:   Physical Exam BP 120/88  Pulse 77  Temp 97.8 F (36.6 C) (Oral)  Ht 5\' 2"  (1.575 m)  Wt 198 lb 8 oz (90.039 kg)  BMI 36.31 kg/m2  SpO2 99% Physical Exam  VS noted Constitutional: Pt is oriented to person, place,  and time. Appears well-developed and well-nourished.  HENT:  Head: Normocephalic and atraumatic.  Right Ear: External ear normal.  Left Ear: External ear normal.  Nose: Nose normal.  Mouth/Throat: Oropharynx is clear  and moist.  Eyes: Conjunctivae and EOM are normal. Pupils are equal, round, and reactive to light.  Neck: Normal range of motion. Neck supple. No JVD present. No tracheal deviation present.  Cardiovascular: Normal rate, regular rhythm, normal heart sounds and intact distal pulses.   Pulmonary/Chest: Effort normal and breath sounds normal.  Abdominal: Soft. Bowel sounds are normal. There is no tenderness.  Musculoskeletal: Normal range of motion. Exhibits no edema.  Lymphadenopathy:  Has no cervical adenopathy.  Neurological: Pt is alert and oriented to person, place, and time. Pt has normal reflexes. No cranial nerve deficit. Motor/dtr intact Skin: Skin is warm and dry. No rash noted.  Psychiatric:  Has  normal mood and affect. Behavior is normal.  Bilat knees with FROM, but minor crepitus, no effusion     Assessment & Plan:

## 2012-01-30 NOTE — Patient Instructions (Addendum)
Take all new medications as prescribed  - the lipitor 10 mg per day Continue all other medications as before Your medications were refilled today Please also take the Aspirin 81 mg - 1 per day - Enteric Coated only Please have the pharmacy call with any other refills you may need. Please continue your efforts at being more active, low cholesterol diet, and weight control. Thank you for enrolling in MyChart. Please follow the instructions below to securely access your online medical record. MyChart allows you to send messages to your doctor, view your test results, renew your prescriptions, schedule appointments, and more. To Log into MyChart, please go to [email or web address], and your Username is:  [email or web address] Please return in 1 year for your yearly visit, or sooner if needed, with Lab testing done 3-5 days before

## 2012-02-18 ENCOUNTER — Other Ambulatory Visit (INDEPENDENT_AMBULATORY_CARE_PROVIDER_SITE_OTHER): Payer: 59

## 2012-02-18 ENCOUNTER — Encounter: Payer: Self-pay | Admitting: Internal Medicine

## 2012-02-18 ENCOUNTER — Ambulatory Visit (INDEPENDENT_AMBULATORY_CARE_PROVIDER_SITE_OTHER): Payer: 59 | Admitting: Internal Medicine

## 2012-02-18 VITALS — BP 110/82 | HR 75 | Temp 99.0°F | Ht 62.0 in | Wt 195.2 lb

## 2012-02-18 DIAGNOSIS — I10 Essential (primary) hypertension: Secondary | ICD-10-CM

## 2012-02-18 DIAGNOSIS — N912 Amenorrhea, unspecified: Secondary | ICD-10-CM

## 2012-02-18 DIAGNOSIS — R300 Dysuria: Secondary | ICD-10-CM

## 2012-02-18 DIAGNOSIS — J309 Allergic rhinitis, unspecified: Secondary | ICD-10-CM

## 2012-02-18 LAB — POCT URINALYSIS DIPSTICK
Ketones, UA: NEGATIVE
Protein, UA: NEGATIVE
Spec Grav, UA: 1.01
Urobilinogen, UA: NEGATIVE
pH, UA: 7

## 2012-02-18 MED ORDER — CEPHALEXIN 500 MG PO CAPS: 500.0000 mg | ORAL_CAPSULE | Freq: Four times a day (QID) | ORAL | Status: DC

## 2012-02-18 NOTE — Assessment & Plan Note (Signed)
prob cystitis is seems, for urine cx, and antibx asd,  to f/u any worsening symptoms or concerns

## 2012-02-18 NOTE — Patient Instructions (Addendum)
Take all new medications as prescribed - the antibiotic Your specimen will be sent to the lab for culture Continue all other medications as before You can also try OTC allegra as needed for possible allergies Please have the pharmacy call with any other refills you may need. Please go to LAB in the Basement for the blood and/or urine tests to be done today - the pregnancy test, and FSH You will be contacted by phone if any changes need to be made immediately.  Otherwise, you will receive a letter about your results with an explanation, but please check with MyChart first.

## 2012-02-18 NOTE — Assessment & Plan Note (Signed)
Likely menopausal, for FSH, bhcg,  to f/u any worsening symptoms or concerns and with GYN as planned

## 2012-02-18 NOTE — Assessment & Plan Note (Signed)
Ok for allegra otc prn,  to f/u any worsening symptoms or concerns 

## 2012-02-18 NOTE — Progress Notes (Signed)
Subjective:    Patient ID: [name] [name], female    DOB: 23-Apr-1960, 51 y.o.   MRN: [id number]  HPI  Here to f/u; overall doing ok, but has had 2 days onset urinary symptoms with dysuria, but ? Frequency, and no urgency,or hematuria, flank pain, n/v, high fever, chills.   Does have several wks ongoing nasal allergy symptoms with clear congestion, itch and sneeze, without fever, pain, ST, cough or wheezing.  Also mentions had somewhat late menarche, and now with menses stopped only 2 mo ago; asks for [facility] adn preg test to be sure,  No other GYN symtpoms or hot flashes.  Has appt to f/u with GYN next month.    Pt denies chest pain, increased sob or doe, wheezing, orthopnea, PND, increased LE swelling, palpitations, dizziness or syncope.  Pt denies new neurological symptoms such as new headache, or facial or extremity weakness or numbness   Pt denies polydipsia, polyuria  Pt states overall good compliance with meds.   Past Medical History  Diagnosis Date  . ANEMIA-NOS 12/08/2006  . ANKLE PAIN, RIGHT 04/18/2010  . BACK PAIN 12/08/2006  . BREAST HYPERTROPHY 02/05/2008  . CHEST PAIN 01/12/2009  . Diarrhea 03/09/2007  . DIVERTICULOSIS, COLON 04/18/2010  . FEVER UNSPECIFIED 04/18/2010  . GERD 12/08/2006  . Headache 04/18/2010  . HYPERLIPIDEMIA 12/08/2006  . HYPERTENSION 12/08/2006  . HYPOKALEMIA 12/08/2006  . HYPOTHYROIDISM 02/05/2008  . LIPOMA 04/18/2010  . OVERWEIGHT/OBESITY 01/11/2010  . Shortness of breath 01/12/2009  . SHOULDER PAIN, RIGHT 03/09/2007  . URINALYSIS, ABNORMAL 11/23/2009  . URI 11/23/2009  . Mild mitral regurgitation by prior echocardiogram 02/07/2011   No past surgical history on file.  reports that she has never smoked. She does not have any smokeless tobacco history on file. She reports that she drinks alcohol. She reports that she does not use illicit drugs. family history includes Cancer in her cousin and other; Diabetes in her mother; Hypertension in her brother, mother, and  sister; and Stroke in her mother. Allergies  Allergen Reactions  . Propoxyphene Hcl     REACTION: nausea   Current Outpatient Prescriptions on File Prior to Visit  Medication Sig Dispense Refill  . aspirin 81 MG EC tablet Take 1 tablet (81 mg total) by mouth daily. Swallow whole.  30 tablet  12  . atorvastatin (LIPITOR) 10 MG tablet Take 1 tablet (10 mg total) by mouth daily.  90 tablet  3  . levothyroxine (SYNTHROID, LEVOTHROID) 50 MCG tablet Take 1 tablet (50 mcg total) by mouth daily.  90 tablet  3  . potassium chloride (K-DUR) 10 MEQ tablet Take 1 tablet (10 mEq total) by mouth daily.  90 tablet  3  . valsartan-hydrochlorothiazide (DIOVAN HCT) 80-12.5 MG per tablet Take 1 tablet by mouth daily.  90 tablet  3   Review of Systems  Constitutional: Negative for diaphoresis and unexpected weight change.  HENT: Negative for tinnitus.   Eyes: Negative for photophobia and visual disturbance.  Respiratory: Negative for choking and stridor.   Gastrointestinal: Negative for vomiting and blood in stool.  Genitourinary: Negative for hematuria and decreased urine volume.  Musculoskeletal: Negative for gait problem.  Skin: Negative for color change and wound.  Neurological: Negative for tremors and numbness.  Psychiatric/Behavioral: Negative for decreased concentration. The patient is not hyperactive.       Objective:   Physical Exam BP 110/82  Pulse 75  Temp 99 F (37.2 C) (Oral)  Ht 5\' 2"  (1.575 m)  Wt 195  lb 4 oz (88.565 kg)  BMI 35.71 kg/m2  SpO2 98% Physical Exam  VS noted, fatigued appaering Constitutional: Pt appears well-developed and well-nourished.  HENT: Head: Normocephalic.  Right Ear: External ear normal.  Left Ear: External ear normal.  Bilat tm's mild erythema.  Sinus nontender.  Pharynx mild erythema Eyes: Conjunctivae and EOM are normal. Pupils are equal, round, and reactive to light.  Neck: Normal range of motion. Neck supple.  Cardiovascular: Normal rate and  regular rhythm.   Pulmonary/Chest: Effort normal and breath sounds normal.  Abd:  Soft, NT, non-distended, + BS except for mild low mid abd tender, without guarding or rebound Neurological: Pt is alert. Not confused  Skin: Skin is warm. No erythema.  Psychiatric: Pt behavior is normal. Thought content normal.     Assessment & Plan:

## 2012-02-18 NOTE — Assessment & Plan Note (Signed)
stable overall by hx and exam, most recent data reviewed with pt, and pt to continue medical treatment as before BP Readings from Last 3 Encounters:  02/18/12 110/82  01/30/12 120/88  02/07/11 118/66

## 2012-02-20 LAB — URINE CULTURE

## 2012-02-20 LAB — HCG, QUANTITATIVE, PREGNANCY: hCG, Beta Chain, Quant, S: 0.11 m[IU]/mL

## 2012-02-28 ENCOUNTER — Telehealth: Payer: Self-pay | Admitting: Internal Medicine

## 2012-02-28 MED ORDER — CEPHALEXIN 500 MG PO CAPS: 500.0000 mg | ORAL_CAPSULE | Freq: Four times a day (QID) | ORAL | Status: DC

## 2012-02-28 NOTE — Telephone Encounter (Signed)
rx re-done

## 2012-02-28 NOTE — Telephone Encounter (Signed)
Patient says that she lost her antibiotic over the christmas holiday and she would like to have a refill called in for her

## 2012-02-28 NOTE — Telephone Encounter (Signed)
Called the patient left detailed message rx lost has been sent to pharmacy as requested.

## 2012-05-29 ENCOUNTER — Telehealth: Payer: Self-pay | Admitting: Internal Medicine

## 2012-05-29 ENCOUNTER — Encounter: Payer: Self-pay | Admitting: Internal Medicine

## 2012-05-29 ENCOUNTER — Ambulatory Visit (INDEPENDENT_AMBULATORY_CARE_PROVIDER_SITE_OTHER): Payer: 59 | Admitting: Internal Medicine

## 2012-05-29 VITALS — BP 104/78 | HR 86 | Temp 97.2°F

## 2012-05-29 DIAGNOSIS — R5381 Other malaise: Secondary | ICD-10-CM | POA: Insufficient documentation

## 2012-05-29 DIAGNOSIS — R5383 Other fatigue: Secondary | ICD-10-CM | POA: Insufficient documentation

## 2012-05-29 DIAGNOSIS — L03312 Cellulitis of back [any part except buttock]: Secondary | ICD-10-CM | POA: Insufficient documentation

## 2012-05-29 DIAGNOSIS — G4710 Hypersomnia, unspecified: Secondary | ICD-10-CM

## 2012-05-29 DIAGNOSIS — I10 Essential (primary) hypertension: Secondary | ICD-10-CM

## 2012-05-29 DIAGNOSIS — L02219 Cutaneous abscess of trunk, unspecified: Secondary | ICD-10-CM

## 2012-05-29 DIAGNOSIS — L03319 Cellulitis of trunk, unspecified: Secondary | ICD-10-CM

## 2012-05-29 MED ORDER — AZITHROMYCIN 250 MG PO TABS: ORAL_TABLET | ORAL | Status: DC

## 2012-05-29 NOTE — Assessment & Plan Note (Signed)
Etiology unclear, Exam otherwise benign, to check labs as documented, follow with expectant management  

## 2012-05-29 NOTE — Assessment & Plan Note (Signed)
stable overall by history and exam, recent data reviewed with pt, and pt to continue medical treatment as before,  to f/u any worsening symptoms or concerns BP Readings from Last 3 Encounters:  05/29/12 104/78  02/18/12 110/82  01/30/12 120/88

## 2012-05-29 NOTE — Telephone Encounter (Signed)
Patient Information:  Caller Name: Nyela  Phone: [phone]  Patient: [name] [name], [name] [name]  Gender: Female  DOB: 02/10/1961  Age: 52 Years  PCP: Oliver Barre (Adults only)  Pregnant: No  Office Follow Up:  Does the office need to follow up with this patient?: No  Instructions For The Office: N/A   Symptoms  Reason For Call & Symptoms: Pt is calling for an appt for a swollen reddened area on her back that is increasing in size. Onset last night. Pt is afeb.  Reviewed Health History In EMR: Yes  Reviewed Medications In EMR: Yes  Reviewed Allergies In EMR: Yes  Reviewed Surgeries / Procedures: Yes  Date of Onset of Symptoms: 05/27/2012 OB / GYN:  LMP: Unknown  Guideline(s) Used:  Skin Lesion - Moles or Growths  Disposition Per Guideline:   See Within 3 Days in Office  Reason For Disposition Reached:   Patient wants to be seen  Advice Given:  N/A  Patient Will Follow Care Advice:  YES  Appointment Scheduled:  05/29/2012 16:00:00 Appointment Scheduled Provider:  Oliver Barre (Adults only)

## 2012-05-29 NOTE — Assessment & Plan Note (Signed)
Possible OSA, for pulm referral

## 2012-05-29 NOTE — Progress Notes (Signed)
Subjective:    Patient ID: [name] [name], female    DOB: 12/13/1960, 52 y.o.   MRN: [id number]  HPI  Here with acute onset red/tender/swollen area to left lower back for 2-3 days after a ? Insect bite but this is not really clear.  Also some itchiness noted, but no fever, red streaks, chills or other red/swelling areas.  No other rash or joint pain, Pt denies chest pain, increased sob or doe, wheezing, orthopnea, PND, increased LE swelling, palpitations, dizziness or syncope.   Pt denies polydipsia, polyuria. Does c/o ongoing fatigue and asks for lab work including thyroid, but also has signficant daytime hypersomnolence, assoc with nighttime snoring, nonrestorative sleep and daytime naps when not working or otherwise stimulated.  Often struggles to stay awake in the evenings after work, trying to keep a regular sleep schedule by bedtime about 10 pm Past Medical History  Diagnosis Date  . ANEMIA-NOS 12/08/2006  . ANKLE PAIN, RIGHT 04/18/2010  . BACK PAIN 12/08/2006  . BREAST HYPERTROPHY 02/05/2008  . CHEST PAIN 01/12/2009  . Diarrhea 03/09/2007  . DIVERTICULOSIS, COLON 04/18/2010  . FEVER UNSPECIFIED 04/18/2010  . GERD 12/08/2006  . Headache 04/18/2010  . HYPERLIPIDEMIA 12/08/2006  . HYPERTENSION 12/08/2006  . HYPOKALEMIA 12/08/2006  . HYPOTHYROIDISM 02/05/2008  . LIPOMA 04/18/2010  . OVERWEIGHT/OBESITY 01/11/2010  . Shortness of breath 01/12/2009  . SHOULDER PAIN, RIGHT 03/09/2007  . URINALYSIS, ABNORMAL 11/23/2009  . URI 11/23/2009  . Mild mitral regurgitation by prior echocardiogram 02/07/2011   No past surgical history on file.  reports that she has never smoked. She does not have any smokeless tobacco history on file. She reports that  drinks alcohol. She reports that she does not use illicit drugs. family history includes Cancer in her cousin and other; Diabetes in her mother; Hypertension in her brother, mother, and sister; and Stroke in her mother. Allergies  Allergen Reactions  .  Propoxyphene Hcl     REACTION: nausea   Current Outpatient Prescriptions on File Prior to Visit  Medication Sig Dispense Refill  . aspirin 81 MG EC tablet Take 1 tablet (81 mg total) by mouth daily. Swallow whole.  30 tablet  12  . atorvastatin (LIPITOR) 10 MG tablet Take 1 tablet (10 mg total) by mouth daily.  90 tablet  3  . cephALEXin (KEFLEX) 500 MG capsule Take 1 capsule (500 mg total) by mouth 4 (four) times daily.  40 capsule  0  . levothyroxine (SYNTHROID, LEVOTHROID) 50 MCG tablet Take 1 tablet (50 mcg total) by mouth daily.  90 tablet  3  . potassium chloride (K-DUR) 10 MEQ tablet Take 1 tablet (10 mEq total) by mouth daily.  90 tablet  3  . valsartan-hydrochlorothiazide (DIOVAN HCT) 80-12.5 MG per tablet Take 1 tablet by mouth daily.  90 tablet  3   No current facility-administered medications on file prior to visit.   Review of Systems  Constitutional: Negative for unexpected weight change, or unusual diaphoresis  HENT: Negative for tinnitus.   Eyes: Negative for photophobia and visual disturbance.  Respiratory: Negative for choking and stridor.   Gastrointestinal: Negative for vomiting and blood in stool.  Genitourinary: Negative for hematuria and decreased urine volume.  Musculoskeletal: Negative for acute joint swelling Skin: Negative for color change and wound.  Neurological: Negative for tremors and numbness other than noted  Psychiatric/Behavioral: Negative for decreased concentration or  hyperactivity.       Objective:   Physical Exam BP 104/78  Pulse 86  Temp(Src)  97.2 F (36.2 C) (Oral)  SpO2 98% VS noted, not ill appearing Constitutional: Pt appears well-developed and well-nourished/obese.  HENT: Head: NCAT.  Right Ear: External ear normal.  Left Ear: External ear normal.  Eyes: Conjunctivae and EOM are normal. Pupils are equal, round, and reactive to light.  Neck: Normal range of motion. Neck supple.  Cardiovascular: Normal rate and regular rhythm.    Pulmonary/Chest: Effort normal and breath sounds normal.  Abd:  Soft, NT, non-distended, + BS Neurological: Pt is alert. Not confused  Skin: left lower back with 4 x 2 cm area red/tender/indurated area without obvious bite lesion, no red streaks or fluctuance Psychiatric: Pt behavior is normal. Thought content normal.     Assessment & Plan:

## 2012-05-29 NOTE — Assessment & Plan Note (Signed)
Mild to mod, for antibx course,  to f/u any worsening symptoms or concerns 

## 2012-05-29 NOTE — Patient Instructions (Signed)
Please take all new medication as prescribed Please continue all other medications as before, and refills have been done if requested. Please go to the LAB in the Basement (turn left off the elevator) for the tests to be done at your convenience You will be contacted regarding the referral for: pulmonary You will be contacted by phone if any changes need to be made immediately.  Otherwise, you will receive a letter about your results with an explanation, but please check with MyChart first. Thank you for enrolling in MyChart. Please follow the instructions below to securely access your online medical record. MyChart allows you to send messages to your doctor, view your test results, renew your prescriptions, schedule appointments, and more.

## 2012-06-02 ENCOUNTER — Encounter: Payer: Self-pay | Admitting: Internal Medicine

## 2012-06-05 ENCOUNTER — Other Ambulatory Visit (INDEPENDENT_AMBULATORY_CARE_PROVIDER_SITE_OTHER): Payer: 59

## 2012-06-05 DIAGNOSIS — R5381 Other malaise: Secondary | ICD-10-CM

## 2012-06-05 LAB — BASIC METABOLIC PANEL
BUN: 13 mg/dL (ref 6–23)
CO2: 27 mEq/L (ref 19–32)
Chloride: 101 mEq/L (ref 96–112)
Glucose, Bld: 85 mg/dL (ref 70–99)
Potassium: 3 mEq/L — ABNORMAL LOW (ref 3.5–5.1)

## 2012-06-05 LAB — CBC WITH DIFFERENTIAL/PLATELET
Eosinophils Absolute: 0.2 10*3/uL (ref 0.0–0.7)
Eosinophils Relative: 3 % (ref 0.0–5.0)
MCV: 79.9 fl (ref 78.0–100.0)
Monocytes Absolute: 0.5 10*3/uL (ref 0.1–1.0)
Neutrophils Relative %: 51.5 % (ref 43.0–77.0)
Platelets: 404 10*3/uL — ABNORMAL HIGH (ref 150.0–400.0)
WBC: 6.4 10*3/uL (ref 4.5–10.5)

## 2012-06-05 LAB — HEPATIC FUNCTION PANEL
ALT: 24 U/L (ref 0–35)
AST: 25 U/L (ref 0–37)
Total Bilirubin: 0.6 mg/dL (ref 0.3–1.2)
Total Protein: 6.8 g/dL (ref 6.0–8.3)

## 2012-06-05 LAB — TSH: TSH: 3.26 u[IU]/mL (ref 0.35–5.50)

## 2012-06-09 ENCOUNTER — Telehealth: Payer: Self-pay

## 2012-06-09 MED ORDER — DOXYCYCLINE HYCLATE 100 MG PO TABS: 100.0000 mg | ORAL_TABLET | Freq: Two times a day (BID) | ORAL | Status: DC

## 2012-06-09 NOTE — Telephone Encounter (Signed)
Pt called stating that cellulitis on her back has no completely cleared. Pt is requesting refill of ABX, please advise.

## 2012-06-09 NOTE — Telephone Encounter (Signed)
Ok to try change to doxy course, but may need to see derm if not improved

## 2012-06-09 NOTE — Telephone Encounter (Signed)
Called the patient left detailed message of MD response and instructions.

## 2012-06-23 ENCOUNTER — Ambulatory Visit (INDEPENDENT_AMBULATORY_CARE_PROVIDER_SITE_OTHER): Payer: 59 | Admitting: Pulmonary Disease

## 2012-06-23 ENCOUNTER — Encounter: Payer: Self-pay | Admitting: Pulmonary Disease

## 2012-06-23 VITALS — BP 120/84 | HR 88 | Temp 97.6°F | Ht 62.0 in | Wt 194.4 lb

## 2012-06-23 DIAGNOSIS — R5381 Other malaise: Secondary | ICD-10-CM

## 2012-06-23 DIAGNOSIS — R5383 Other fatigue: Secondary | ICD-10-CM

## 2012-06-23 NOTE — Patient Instructions (Addendum)
My suspicion for sleep apnea or other sleep disorder is low, however that does not mean you do not have sleep apnea.  You need to consider the severity of your symptoms, and also take into account the impact to your quality of life.  We can do the sleep study for further evaluation if you feel this is having a big impact.  Please call to let me know.  In the meantime, work on weight reduction.

## 2012-06-23 NOTE — Assessment & Plan Note (Signed)
The patient is complaining primarily of fatigue rather than true sleepiness.  Her Epworth score is also normal today.  That being said, I cannot exclude the possibility of underlying sleep disordered breathing or another sleep disorder such as periodic limb movement contributing to her symptoms.  I have given her the option of taking a few months to work aggressively on weight loss to see if things improve, versus proceeding with a sleep study for more definitive evaluation.  I certainly don't think she has moderate or worse sleep apnea, and unlikely to be an impact to her health.  The patient would like to take some time to think about this, and will call me with her decision.

## 2012-06-23 NOTE — Progress Notes (Signed)
Subjective:    Patient ID: [name] [name], female    DOB: 11-Sep-1960, 52 y.o.   MRN: [id number]  HPI The patient is a 52 year old female who I've been asked to see for possible obstructive sleep apnea.  She has been complaining of significant fatigue, and that she "feels tired all the time".  However, she does not feel this is true sleepiness.  She has been told that she has very light snoring only during the first part of the night, but no one has ever mentioned an abnormal breathing pattern during sleep.  She denies any choking arousals.  She rarely awakens during the night, although she does not feel rested the next morning upon arising.  No one has ever mentioned kicking during the night, and she denies restless leg syndrome symptoms.  She denies sleep pressure or daytime sleepiness, and has no issues watching television or movies if they hold her interest.  She has no sleepiness with driving.  Her Epworth score today is normal at 8, and she tells me her weight is neutral over the last 2 years.   Sleep Questionnaire What time do you typically go to bed?( Between what hours) 10-11p 10-11p at 0944 on 06/23/12 by Nita Sells, CMA How long does it take you to fall asleep?  at 0944 on 06/23/12 by Nita Sells, CMA How many times during the night do you wake up? 1 1 at 0944 on 06/23/12 by Nita Sells, CMA What time do you get out of bed to start your day? 0600 0600 at 0944 on 06/23/12 by Nita Sells, CMA Do you drive or operate heavy machinery in your occupation? No No at 0944 on 06/23/12 by Nita Sells, CMA How much has your weight changed (up or down) over the past two years? (In pounds) 10 lb (4.536 kg)10 lb (4.536 kg) 10lb varies +/-....Marland Kitchenstable at 0944 on 06/23/12 by Nita Sells, CMA Have you ever had a sleep study before? No No at 0944 on 06/23/12 by Nita Sells, CMA If yes, location of study? If yes, date of study? Do you currently use CPAP? No No at 0944  on 06/23/12 by Nita Sells, CMA Do you wear oxygen at any time? No No at 0944 on 06/23/12 by Nita Sells, CMA   Review of Systems  Constitutional: Negative for fever and unexpected weight change.  HENT: Negative for ear pain, nosebleeds, congestion, sore throat, rhinorrhea, sneezing, trouble swallowing, dental problem, postnasal drip and sinus pressure.   Eyes: Negative for redness and itching.  Respiratory: Negative for cough, chest tightness, shortness of breath and wheezing.   Cardiovascular: Positive for palpitations ( Sees Dr Daleen Squibb for heart murmur). Negative for leg swelling.  Gastrointestinal: Negative for nausea and vomiting.  Genitourinary: Negative for dysuria.  Musculoskeletal: Negative for joint swelling.  Skin: Negative for rash.  Neurological: Positive for headaches.  Hematological: Does not bruise/bleed easily.  Psychiatric/Behavioral: Negative for dysphoric mood. The patient is not nervous/anxious.        Objective:   Physical Exam Constitutional:  Overweight female, no acute distress  HENT:  Nares patent without discharge  Oropharynx without exudate, palate and uvula are normal, tongue large for her oral cavity.   Eyes:  Perrla, eomi, no scleral icterus  Neck:  No JVD, no TMG  Cardiovascular:  Normal rate, regular rhythm, no rubs or gallops.  No murmurs        Intact distal pulses  Pulmonary :  Normal breath sounds, no stridor or respiratory distress   No rales, rhonchi, or wheezing  Abdominal:  Soft, nondistended, bowel sounds present.  No tenderness noted.   Musculoskeletal:  No lower extremity edema noted.  Lymph Nodes:  No cervical lymphadenopathy noted  Skin:  No cyanosis noted  Neurologic:  Alert, appropriate, moves all 4 extremities without obvious deficit.         Assessment & Plan:

## 2012-06-25 ENCOUNTER — Telehealth: Payer: Self-pay | Admitting: *Deleted

## 2012-06-25 NOTE — Telephone Encounter (Signed)
[facility] informed.  

## 2012-06-25 NOTE — Telephone Encounter (Signed)
yes

## 2012-06-25 NOTE — Telephone Encounter (Signed)
R'cd fax from [place] [facility] to switch Levothyroxine manufactures from [employer] to [employer] since [place] [facility] no longer carries [employer] brand-please advise in JWJ's absence.

## 2012-11-16 LAB — HM MAMMOGRAPHY

## 2012-12-31 ENCOUNTER — Other Ambulatory Visit: Payer: Self-pay

## 2013-02-06 ENCOUNTER — Other Ambulatory Visit: Payer: Self-pay | Admitting: Internal Medicine

## 2013-03-09 ENCOUNTER — Telehealth: Payer: Self-pay

## 2013-03-09 DIAGNOSIS — Z0000 Encounter for general adult medical examination without abnormal findings: Secondary | ICD-10-CM

## 2013-03-09 NOTE — Telephone Encounter (Signed)
Labs entered.

## 2013-05-27 ENCOUNTER — Other Ambulatory Visit: Payer: Self-pay | Admitting: Internal Medicine

## 2013-06-02 ENCOUNTER — Encounter: Payer: 59 | Admitting: Internal Medicine

## 2013-07-01 ENCOUNTER — Encounter: Payer: 59 | Admitting: Internal Medicine

## 2013-07-16 ENCOUNTER — Encounter: Payer: 59 | Admitting: Internal Medicine

## 2013-08-26 ENCOUNTER — Other Ambulatory Visit: Payer: Self-pay | Admitting: Internal Medicine

## 2013-09-07 ENCOUNTER — Ambulatory Visit (INDEPENDENT_AMBULATORY_CARE_PROVIDER_SITE_OTHER): Payer: 59 | Admitting: Internal Medicine

## 2013-09-07 ENCOUNTER — Other Ambulatory Visit: Payer: Self-pay | Admitting: Internal Medicine

## 2013-09-07 ENCOUNTER — Encounter: Payer: Self-pay | Admitting: Internal Medicine

## 2013-09-07 VITALS — BP 112/70 | HR 85 | Temp 98.1°F | Wt 200.0 lb

## 2013-09-07 DIAGNOSIS — E785 Hyperlipidemia, unspecified: Secondary | ICD-10-CM

## 2013-09-07 DIAGNOSIS — E876 Hypokalemia: Secondary | ICD-10-CM

## 2013-09-07 DIAGNOSIS — E039 Hypothyroidism, unspecified: Secondary | ICD-10-CM

## 2013-09-07 DIAGNOSIS — I10 Essential (primary) hypertension: Secondary | ICD-10-CM

## 2013-09-07 MED ORDER — LEVOTHYROXINE SODIUM 50 MCG PO TABS: 50.0000 ug | ORAL_TABLET | Freq: Every day | ORAL | Status: DC

## 2013-09-07 MED ORDER — POTASSIUM CHLORIDE ER 10 MEQ PO TBCR: 10.0000 meq | EXTENDED_RELEASE_TABLET | Freq: Every day | ORAL | Status: DC

## 2013-09-07 MED ORDER — VALSARTAN-HYDROCHLOROTHIAZIDE 80-12.5 MG PO TABS: 1.0000 | ORAL_TABLET | Freq: Every day | ORAL | Status: DC

## 2013-09-07 NOTE — Progress Notes (Signed)
Pre visit review using our clinic review tool, if applicable. No additional management support is needed unless otherwise documented below in the visit note. 

## 2013-09-07 NOTE — Patient Instructions (Signed)
Please continue all other medications as before, and refills have been done if requested.  Please have the pharmacy call with any other refills you may need.  Please continue your efforts at being more active, low cholesterol diet, and weight control.  Please keep your appointments with your specialists as you may have planned     

## 2013-09-07 NOTE — Progress Notes (Addendum)
Subjective:    Patient ID: [name] [name], female    DOB: 1960-12-09, 53 y.o.   MRN: [id number]  HPI  Here to f/u; overall doing ok,  Pt denies chest pain, increased sob or doe, wheezing, orthopnea, PND, increased LE swelling, palpitations, dizziness or syncope.  Pt denies polydipsia, polyuria, or low sugar symptoms such as weakness or confusion improved with po intake.  Pt denies new neurological symptoms such as new headache, or facial or extremity weakness or numbness.   Pt states overall good compliance with meds, has been trying to follow lower cholesterol diet, with wt overall stable, trying to be more active. Denies hyper or hypo thyroid symptoms such as voice, skin or hair change.  Sometimes feels "off" , mostly fatigued Past Medical History  Diagnosis Date  . ANEMIA-NOS 12/08/2006  . ANKLE PAIN, RIGHT 04/18/2010  . BACK PAIN 12/08/2006  . BREAST HYPERTROPHY 02/05/2008  . CHEST PAIN 01/12/2009  . Diarrhea 03/09/2007  . DIVERTICULOSIS, COLON 04/18/2010  . FEVER UNSPECIFIED 04/18/2010  . GERD 12/08/2006  . Headache(784.0) 04/18/2010  . HYPERLIPIDEMIA 12/08/2006  . HYPERTENSION 12/08/2006  . HYPOKALEMIA 12/08/2006  . HYPOTHYROIDISM 02/05/2008  . LIPOMA 04/18/2010  . OVERWEIGHT/OBESITY 01/11/2010  . Shortness of breath 01/12/2009  . SHOULDER PAIN, RIGHT 03/09/2007  . URINALYSIS, ABNORMAL 11/23/2009  . URI 11/23/2009  . Mild mitral regurgitation by prior echocardiogram 02/07/2011   Past Surgical History  Procedure Laterality Date  . Breast reduction surgery  2012    reports that she has never smoked. She does not have any smokeless tobacco history on file. She reports that she drinks alcohol. She reports that she does not use illicit drugs. family history includes Cancer in her cousin and other; Diabetes in her mother; Hypertension in her brother, mother, and sister; Stroke in her mother. Allergies  Allergen Reactions  . Propoxyphene Hcl     *DARVOCET*    REACTION: nausea    Current Outpatient Prescriptions on File Prior to Visit  Medication Sig Dispense Refill  . aspirin 81 MG EC tablet Take 1 tablet (81 mg total) by mouth daily. Swallow whole.  30 tablet  12   No current facility-administered medications on file prior to visit.   Review of Systems  Constitutional: Negative for unusual diaphoresis or other sweats  HENT: Negative for ringing in ear Eyes: Negative for double vision or worsening visual disturbance.  Respiratory: Negative for choking and stridor.   Gastrointestinal: Negative for vomiting or other signifcant bowel change Genitourinary: Negative for hematuria or decreased urine volume.  Musculoskeletal: Negative for other MSK pain or swelling Skin: Negative for color change and worsening wound.  Neurological: Negative for tremors and numbness other than noted  Psychiatric/Behavioral: Negative for decreased concentration or agitation other than above       Objective:   Physical Exam BP 112/70  Pulse 85  Temp(Src) 98.1 F (36.7 C) (Oral)  Wt 200 lb (90.719 kg)  SpO2 97% VS noted,  Constitutional: Pt appears well-developed, well-nourished.  HENT: Head: NCAT.  Right Ear: External ear normal.  Left Ear: External ear normal.  Eyes: . Pupils are equal, round, and reactive to light. Conjunctivae and EOM are normal Neck: Normal range of motion. Neck supple.  Cardiovascular: Normal rate and regular rhythm.   Pulmonary/Chest: Effort normal and breath sounds normal.  Abd:  Soft, NT, ND, + BS Neurological: Pt is alert. Not confused , motor grossly intact Skin: Skin is warm. No rash Psychiatric: Pt behavior is normal. No agitation.  Assessment & Plan:

## 2013-09-09 NOTE — Assessment & Plan Note (Signed)
stable overall by history and exam, recent data reviewed with pt, and pt to continue medical treatment as before,  to f/u any worsening symptoms or concerns Lab Results  Component Value Date   TSH 3.26 06/05/2012   For f/u lab

## 2013-09-09 NOTE — Assessment & Plan Note (Signed)
Likely due to hct, for lab f/u, kdur 20 qd

## 2013-09-09 NOTE — Assessment & Plan Note (Signed)
stable overall by history and exam, recent data reviewed with pt, and pt to continue medical treatment as before,  to f/u any worsening symptoms or concerns BP Readings from Last 3 Encounters:  09/07/13 112/70  06/23/12 120/84  05/29/12 104/78

## 2013-09-09 NOTE — Assessment & Plan Note (Signed)
stable overall by history and exam, recent data reviewed with pt, and pt to continue medical treatment as before,  to f/u any worsening symptoms or concerns Lab Results  Component Value Date   LDLCALC 115* 01/27/2012   Cont diet for now per pt request, declines statin for now

## 2013-09-13 ENCOUNTER — Telehealth: Payer: Self-pay | Admitting: *Deleted

## 2013-09-13 NOTE — Telephone Encounter (Signed)
[facility] Triage Call Report Triage Record Num: [id number] Operator: Aurelio Jew Patient Name: [name] [name] Call Date & Time: 09/07/2013 8:30:40PM Patient Phone: [phone] PCP: Cathlean Cower Patient Gender: Female PCP Fax : [phone] Patient DOB: 27-Jun-1960 Practice Name[facility] Reason for Call: Caller: Jammie/Patient; PCP: Cathlean Cower (Adults only); CB#: [phone]; Call regarding Medication Issue; Medication(s): Rx called in did not contain all of her medications. Patient states to office today for refills rx. However only K+ called into pharmacy. Reviewed per EPIC and called in Rx for other medications. [facility] with [facility] per request. levothyroxine 79mcg 1 qd #90 3 refills and valsartan/hctz 80-12.5 qd #90 3 refills Lvmm and notified patient that rx called into pharmacy. Callback parameters given. Protocol(s) Used: Medication Questions - Adult Recommended Outcome per Protocol: Call Dispensing Pharmacy or Provider Immediately Reason for Outcome: Unable to obtain prescribed medication related to available resources AND situation poses immediate clinical risk Care Advice: ~ 09/07/2013 9:06:37PM Page 1 of 1 [email or web address]

## 2013-09-15 ENCOUNTER — Encounter: Payer: 59 | Admitting: Internal Medicine

## 2013-12-07 ENCOUNTER — Other Ambulatory Visit (INDEPENDENT_AMBULATORY_CARE_PROVIDER_SITE_OTHER): Payer: 59

## 2013-12-07 DIAGNOSIS — Z0000 Encounter for general adult medical examination without abnormal findings: Secondary | ICD-10-CM

## 2013-12-07 LAB — URINALYSIS, ROUTINE W REFLEX MICROSCOPIC
Bilirubin Urine: NEGATIVE
Ketones, ur: NEGATIVE
Leukocytes, UA: NEGATIVE
Nitrite: NEGATIVE
SPECIFIC GRAVITY, URINE: 1.02 (ref 1.000–1.030)
TOTAL PROTEIN, URINE-UPE24: NEGATIVE
Urine Glucose: NEGATIVE
Urobilinogen, UA: 0.2 (ref 0.0–1.0)
pH: 7 (ref 5.0–8.0)

## 2013-12-07 LAB — CBC WITH DIFFERENTIAL/PLATELET
BASOS PCT: 1.9 % (ref 0.0–3.0)
Basophils Absolute: 0.1 10*3/uL (ref 0.0–0.1)
Eosinophils Absolute: 0.2 10*3/uL (ref 0.0–0.7)
Eosinophils Relative: 3 % (ref 0.0–5.0)
HCT: 41.2 % (ref 36.0–46.0)
HEMOGLOBIN: 13.4 g/dL (ref 12.0–15.0)
Lymphocytes Relative: 46.3 % — ABNORMAL HIGH (ref 12.0–46.0)
Lymphs Abs: 2.7 10*3/uL (ref 0.7–4.0)
MCHC: 32.5 g/dL (ref 30.0–36.0)
MCV: 88.3 fl (ref 78.0–100.0)
MONOS PCT: 5.9 % (ref 3.0–12.0)
Monocytes Absolute: 0.3 10*3/uL (ref 0.1–1.0)
NEUTROS ABS: 2.5 10*3/uL (ref 1.4–7.7)
Neutrophils Relative %: 42.9 % — ABNORMAL LOW (ref 43.0–77.0)
Platelets: 379 10*3/uL (ref 150.0–400.0)
RBC: 4.67 Mil/uL (ref 3.87–5.11)
RDW: 14.2 % (ref 11.5–15.5)
WBC: 5.9 10*3/uL (ref 4.0–10.5)

## 2013-12-07 LAB — BASIC METABOLIC PANEL
BUN: 10 mg/dL (ref 6–23)
CALCIUM: 8.9 mg/dL (ref 8.4–10.5)
CHLORIDE: 103 meq/L (ref 96–112)
CO2: 27 meq/L (ref 19–32)
CREATININE: 0.7 mg/dL (ref 0.4–1.2)
GFR: 121.59 mL/min (ref >60.00)
Glucose, Bld: 82 mg/dL (ref 70–99)
Potassium: 3.3 mEq/L — ABNORMAL LOW (ref 3.5–5.1)
Sodium: 137 mEq/L (ref 135–145)

## 2013-12-07 LAB — LIPID PANEL
CHOL/HDL RATIO: 4
CHOLESTEROL: 194 mg/dL (ref 0–200)
HDL: 50.1 mg/dL (ref >39.00)
LDL Cholesterol: 129 mg/dL — ABNORMAL HIGH (ref 0–99)
NonHDL: 143.9
TRIGLYCERIDES: 73 mg/dL (ref 0.0–149.0)
VLDL: 14.6 mg/dL (ref 0.0–40.0)

## 2013-12-07 LAB — HEPATIC FUNCTION PANEL
ALBUMIN: 3.4 g/dL — AB (ref 3.5–5.2)
ALT: 22 U/L (ref 0–35)
AST: 24 U/L (ref 0–37)
Alkaline Phosphatase: 100 U/L (ref 39–117)
Bilirubin, Direct: 0.1 mg/dL (ref 0.0–0.3)
TOTAL PROTEIN: 7.4 g/dL (ref 6.0–8.3)
Total Bilirubin: 0.4 mg/dL (ref 0.2–1.2)

## 2013-12-07 LAB — TSH: TSH: 2.39 u[IU]/mL (ref 0.35–4.50)

## 2013-12-08 ENCOUNTER — Ambulatory Visit (INDEPENDENT_AMBULATORY_CARE_PROVIDER_SITE_OTHER): Payer: 59 | Admitting: Internal Medicine

## 2013-12-08 ENCOUNTER — Encounter: Payer: Self-pay | Admitting: Internal Medicine

## 2013-12-08 VITALS — BP 128/90 | HR 79 | Temp 98.1°F | Ht 62.0 in | Wt 195.4 lb

## 2013-12-08 DIAGNOSIS — Z0000 Encounter for general adult medical examination without abnormal findings: Secondary | ICD-10-CM

## 2013-12-08 DIAGNOSIS — I340 Nonrheumatic mitral (valve) insufficiency: Secondary | ICD-10-CM

## 2013-12-08 DIAGNOSIS — Z23 Encounter for immunization: Secondary | ICD-10-CM

## 2013-12-08 NOTE — Progress Notes (Signed)
Pre visit review using our clinic review tool, if applicable. No additional management support is needed unless otherwise documented below in the visit note. 

## 2013-12-08 NOTE — Assessment & Plan Note (Signed)
For card f/u  - will refer

## 2013-12-08 NOTE — Patient Instructions (Addendum)
You had the flu shot today  You will be contacted regarding the referral for:  mammogram. And cardiology  Please continue all other medications as before, and refills have been done if requested.  Please have the pharmacy call with any other refills you may need.  Please continue your efforts at being more active, low cholesterol diet, and weight control.  You are otherwise up to date with prevention measures today.  Please keep your appointments with your specialists as you may have planned  Your EKG and Lab work was OK today  Please return in 1 year for your yearly visit, or sooner if needed, with Lab testing done 3-5 days before

## 2013-12-08 NOTE — Progress Notes (Signed)
Subjective:    Patient ID: [name] [name], female    DOB: April 05, 1960, 53 y.o.   MRN: [id number]  HPI  Here for wellness and f/u;  Overall doing ok;  Pt denies CP, worsening SOB, DOE, wheezing, orthopnea, PND, worsening LE edema, palpitations, dizziness or syncope.  Pt denies neurological change such as new headache, facial or extremity weakness.  Pt denies polydipsia, polyuria, or low sugar symptoms. Pt states overall good compliance with treatment and medications, good tolerability, and has been trying to follow lower cholesterol diet.  Pt denies worsening depressive symptoms, suicidal ideation or panic. No fever, night sweats, wt loss, loss of appetite, or other constitutional symptoms.  Pt states good ability with ADL's, has low fall risk, home safety reviewed and adequate, no other significant changes in hearing or vision, and only occasionally active with exercise.  No current complaints. Missed f/U with cardiology 2014 for MR. Sees GYN yearly, due for mammogram, and still having some irreg bleeding, but much less recently. Taking MVI with iron.  Not taking the potassium dialy, thought it might not be that important.  Plans to be more compliant. Also declines statin, will try for lower chol diet. Past Medical History  Diagnosis Date  . ANEMIA-NOS 12/08/2006  . ANKLE PAIN, RIGHT 04/18/2010  . BACK PAIN 12/08/2006  . BREAST HYPERTROPHY 02/05/2008  . CHEST PAIN 01/12/2009  . Diarrhea 03/09/2007  . DIVERTICULOSIS, COLON 04/18/2010  . FEVER UNSPECIFIED 04/18/2010  . GERD 12/08/2006  . Headache(784.0) 04/18/2010  . HYPERLIPIDEMIA 12/08/2006  . HYPERTENSION 12/08/2006  . HYPOKALEMIA 12/08/2006  . HYPOTHYROIDISM 02/05/2008  . LIPOMA 04/18/2010  . OVERWEIGHT/OBESITY 01/11/2010  . Shortness of breath 01/12/2009  . SHOULDER PAIN, RIGHT 03/09/2007  . URINALYSIS, ABNORMAL 11/23/2009  . URI 11/23/2009  . Mild mitral regurgitation by prior echocardiogram 02/07/2011   Past Surgical History  Procedure  Laterality Date  . Breast reduction surgery  2012    reports that she has never smoked. She does not have any smokeless tobacco history on file. She reports that she drinks alcohol. She reports that she does not use illicit drugs. family history includes Cancer in her cousin and other; Diabetes in her mother; Hypertension in her brother, mother, and sister; Stroke in her mother. Allergies  Allergen Reactions  . Propoxyphene Hcl     *DARVOCET*    REACTION: nausea   Current Outpatient Prescriptions on File Prior to Visit  Medication Sig Dispense Refill  . aspirin 81 MG EC tablet Take 1 tablet (81 mg total) by mouth daily. Swallow whole.  30 tablet  12  . levothyroxine (SYNTHROID, LEVOTHROID) 50 MCG tablet Take 1 tablet (50 mcg total) by mouth daily.  90 tablet  3  . potassium chloride (K-DUR) 10 MEQ tablet Take 1 tablet (10 mEq total) by mouth daily.  90 tablet  3  . valsartan-hydrochlorothiazide (DIOVAN-HCT) 80-12.5 MG per tablet TAKE ONE TABLET BY MOUTH ONCE DAILY  90 tablet  3   No current facility-administered medications on file prior to visit.    Review of Systems Constitutional: Negative for increased diaphoresis, other activity, appetite or other siginficant weight change  HENT: Negative for worsening hearing loss, ear pain, facial swelling, mouth sores and neck stiffness.   Eyes: Negative for other worsening pain, redness or visual disturbance.  Respiratory: Negative for shortness of breath and wheezing.   Cardiovascular: Negative for chest pain and palpitations.  Gastrointestinal: Negative for diarrhea, blood in stool, abdominal distention or other pain Genitourinary: Negative for hematuria,  flank pain or change in urine volume.  Musculoskeletal: Negative for myalgias or other joint complaints.  Skin: Negative for color change and wound.  Neurological: Negative for syncope and numbness. other than noted Hematological: Negative for adenopathy. or other  swelling Psychiatric/Behavioral: Negative for hallucinations, self-injury, decreased concentration or other worsening agitation.      Objective:   Physical Exam BP 128/90  Pulse 79  Temp(Src) 98.1 F (36.7 C) (Oral)  Ht 5\' 2"  (1.575 m)  Wt 195 lb 6 oz (88.622 kg)  BMI 35.73 kg/m2  SpO2 96% VS noted,  Constitutional: Pt is oriented to person, place, and time. Appears well-developed and well-nourished.  Head: Normocephalic and atraumatic.  Right Ear: External ear normal.  Left Ear: External ear normal.  Nose: Nose normal.  Mouth/Throat: Oropharynx is clear and moist.  Eyes: Conjunctivae and EOM are normal. Pupils are equal, round, and reactive to light.  Neck: Normal range of motion. Neck supple. No JVD present. No tracheal deviation present.  Cardiovascular: Normal rate, regular rhythm, normal heart sounds and intact distal pulses.   Pulmonary/Chest: Effort normal and breath sounds without rales or wheezing  Abdominal: Soft. Bowel sounds are normal. NT. No HSM  Musculoskeletal: Normal range of motion. Exhibits no edema.  Lymphadenopathy:  Has no cervical adenopathy.  Neurological: Pt is alert and oriented to person, place, and time. Pt has normal reflexes. No cranial nerve deficit. Motor grossly intact Skin: Skin is warm and dry. No rash noted. Has < 1 cm NT left temple subq fatty nodule Psychiatric:  Has normal mood and affect. Behavior is normal.     Assessment & Plan:

## 2013-12-08 NOTE — Assessment & Plan Note (Signed)

## 2014-01-04 NOTE — Progress Notes (Signed)
      HPI: 53 year old female previously followed by Dr. Verl Blalock for follow-up of hypertension. Echocardiogram in December 2012 showed normal LV function and mild mitral regurgitation. Since last seen,   Current Outpatient Prescriptions  Medication Sig Dispense Refill  . aspirin 81 MG EC tablet Take 1 tablet (81 mg total) by mouth daily. Swallow whole. 30 tablet 12  . levothyroxine (SYNTHROID, LEVOTHROID) 50 MCG tablet Take 1 tablet (50 mcg total) by mouth daily. 90 tablet 3  . potassium chloride (K-DUR) 10 MEQ tablet Take 1 tablet (10 mEq total) by mouth daily. 90 tablet 3  . valsartan-hydrochlorothiazide (DIOVAN-HCT) 80-12.5 MG per tablet TAKE ONE TABLET BY MOUTH ONCE DAILY 90 tablet 3   No current facility-administered medications for this visit.     Past Medical History  Diagnosis Date  . ANEMIA-NOS 12/08/2006  . ANKLE PAIN, RIGHT 04/18/2010  . BACK PAIN 12/08/2006  . BREAST HYPERTROPHY 02/05/2008  . CHEST PAIN 01/12/2009  . Diarrhea 03/09/2007  . DIVERTICULOSIS, COLON 04/18/2010  . FEVER UNSPECIFIED 04/18/2010  . GERD 12/08/2006  . Headache(784.0) 04/18/2010  . HYPERLIPIDEMIA 12/08/2006  . HYPERTENSION 12/08/2006  . HYPOKALEMIA 12/08/2006  . HYPOTHYROIDISM 02/05/2008  . LIPOMA 04/18/2010  . OVERWEIGHT/OBESITY 01/11/2010  . Shortness of breath 01/12/2009  . SHOULDER PAIN, RIGHT 03/09/2007  . URINALYSIS, ABNORMAL 11/23/2009  . URI 11/23/2009  . Mild mitral regurgitation by prior echocardiogram 02/07/2011    Past Surgical History  Procedure Laterality Date  . Breast reduction surgery  2012    History   Social History  . Marital Status: Married    Spouse Name: N/A    Number of Children: N/A  . Years of Education: N/A   Occupational History  . 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 Smoking status: Never Smoker   . Smokeless tobacco: Not on file  . Alcohol Use: Yes     Comment: rare  . Drug Use: No  . Sexual Activity: Not  on file   Other Topics Concern  . Not on file   Social History Narrative  . No narrative on file    ROS: no fevers or chills, productive cough, hemoptysis, dysphasia, odynophagia, melena, hematochezia, dysuria, hematuria, rash, seizure activity, orthopnea, PND, pedal edema, claudication. Remaining systems are negative.  Physical Exam: Well-developed well-nourished in no acute distress.  Skin is warm and dry.  HEENT is normal.  Neck is supple.  Chest is clear to auscultation with normal expansion.  Cardiovascular exam is regular rate and rhythm.  Abdominal exam nontender or distended. No masses palpated. Extremities show no edema. neuro grossly intact  ECG     This encounter was created in error - please disregard.

## 2014-01-07 ENCOUNTER — Encounter: Payer: 59 | Admitting: Cardiology

## 2014-02-07 ENCOUNTER — Encounter: Payer: Self-pay | Admitting: Internal Medicine

## 2014-03-07 ENCOUNTER — Encounter: Payer: Self-pay | Admitting: Cardiology

## 2014-03-07 ENCOUNTER — Ambulatory Visit (INDEPENDENT_AMBULATORY_CARE_PROVIDER_SITE_OTHER): Payer: 59 | Admitting: Cardiology

## 2014-03-07 VITALS — BP 116/86 | HR 71 | Ht 62.0 in | Wt 195.1 lb

## 2014-03-07 DIAGNOSIS — I340 Nonrheumatic mitral (valve) insufficiency: Secondary | ICD-10-CM

## 2014-03-07 NOTE — Progress Notes (Signed)
HPI The patient presents for followup of her greater than 3 years and she was seen by Dr. Verl Blalock.  She did have some mild mitral regurgitation previous echoes. He returns for followup of this. She's had some chronic dyspnea with activities but this is not particularly different.  The patient denies any new symptoms such as chest discomfort, neck or arm discomfort. There has been no new shortness of breath, PND or orthopnea. There have been no reported palpitations, presyncope or syncope.   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    Allergies  Allergen Reactions  . Propoxyphene Hcl     *DARVOCET*    REACTION: nausea    Current Outpatient Prescriptions  Medication Sig Dispense Refill  . aspirin 81 MG EC tablet Take 1 tablet (81 mg total) by mouth daily. Swallow whole. 30 tablet 12  . levothyroxine (SYNTHROID, LEVOTHROID) 50 MCG tablet Take 1 tablet (50 mcg total) by mouth daily. 90 tablet 3  . potassium chloride (K-DUR) 10 MEQ tablet Take 1 tablet (10 mEq total) by mouth daily. 90 tablet 3  . valsartan-hydrochlorothiazide (DIOVAN-HCT) 80-12.5 MG per tablet TAKE ONE TABLET BY MOUTH ONCE DAILY 90 tablet 3   No current facility-administered medications for this visit.    Past Medical History  Diagnosis Date  . ANEMIA-NOS 12/08/2006  . ANKLE PAIN, RIGHT 04/18/2010  . BACK PAIN 12/08/2006  . BREAST HYPERTROPHY 02/05/2008  . CHEST PAIN 01/12/2009  . Diarrhea 03/09/2007  . DIVERTICULOSIS, COLON 04/18/2010  . FEVER UNSPECIFIED 04/18/2010  . GERD 12/08/2006  . Headache(784.0) 04/18/2010  . HYPERLIPIDEMIA 12/08/2006  . HYPERTENSION 12/08/2006  . HYPOKALEMIA 12/08/2006  . HYPOTHYROIDISM 02/05/2008  . LIPOMA 04/18/2010  . OVERWEIGHT/OBESITY 01/11/2010  . Shortness of breath 01/12/2009  . SHOULDER PAIN, RIGHT 03/09/2007  . URINALYSIS, ABNORMAL 11/23/2009  . URI 11/23/2009  . Mild mitral regurgitation by prior echocardiogram 02/07/2011    Past Surgical History  Procedure Laterality  Date  . Breast reduction surgery  2012    Family History  Problem Relation Age of Onset  . Diabetes Mother   . Stroke Mother   . Hypertension Mother   . Hypertension Sister   . Hypertension Brother   . Cancer Cousin     breast cancer  . Cancer Other     breast cancer    History   Social History  . Marital Status: Married    Spouse Name: N/A    Number of Children: N/A  . Years of Education: N/A   Occupational History  . 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 Smoking status: Never Smoker   . Smokeless tobacco: Not on file  . Alcohol Use: Yes     Comment: rare  . Drug Use: No  . Sexual Activity: Not on file   Other Topics Concern  . Not on file   Social History Narrative    ROS:  Positive for headaches, occasional diarrhea, snoring, leg cramping.  Otherwise as stated in the HPI and negative for all other systems.   PHYSICAL EXAM BP 116/86 mmHg  Pulse 71  Ht 5\' 2"  (1.575 m)  Wt 195 lb 1.6 oz (88.497 kg)  BMI 35.68 kg/m2  GENERAL:  Well appearing HEENT:  Pupils equal round and reactive, fundi not visualized, oral mucosa unremarkable NECK:  No jugular venous distention, waveform within normal limits, carotid upstroke brisk and symmetric, no bruits, no thyromegaly LYMPHATICS:  No cervical, inguinal adenopathy LUNGS:  Clear to auscultation bilaterally BACK:  No CVA tenderness CHEST:  Unremarkable HEART:  PMI not displaced or sustained,S1 and S2 within normal limits, no S3, no S4, no clicks, no rubs, no murmurs ABD:  Flat, positive bowel sounds normal in frequency in pitch, no bruits, no rebound, no guarding, no midline pulsatile mass, no hepatomegaly, no splenomegaly EXT:  2 plus pulses throughout, no edema, no cyanosis no clubbing SKIN:  No rashes no nodules NEURO:  Cranial nerves II through XII grossly intact, motor grossly intact throughout PSYCH:  Cognitively intact, oriented to person place and time   EKG:   Sinus rhythm, rate 71, axis within normal limits, intervals within normal limits, nonspecific diffuse T wave flattening..  03/07/2014   ASSESSMENT AND PLAN  MR:  This has been mild. I will however repeat an echocardiogram as it has been about 4 years. I would suspect this hasn't been worse and that this can be followed clinically.  HTN:  The blood pressure is at target. No change in medications is indicated. We will continue with therapeutic lifestyle changes (TLC).  OVERWEIGHT:  The patient understands the need to lose weight with diet and exercise. We have discussed specific strategies for this.

## 2014-03-07 NOTE — Patient Instructions (Signed)
Your physician has requested that you have an echocardiogram. Echocardiography is a painless test that uses sound waves to create images of your heart. It provides your doctor with information about the size and shape of your heart and how well your heart's chambers and valves are working. This procedure takes approximately one hour. There are no restrictions for this procedure.  Your physician will give you a call on result of Echocardiogram, and will decide if you need a follow-up appointment

## 2014-03-14 ENCOUNTER — Ambulatory Visit (HOSPITAL_COMMUNITY): Payer: 59

## 2014-03-17 ENCOUNTER — Inpatient Hospital Stay (HOSPITAL_COMMUNITY): Admission: RE | Admit: 2014-03-17 | Payer: 59 | Source: Ambulatory Visit

## 2014-03-21 ENCOUNTER — Ambulatory Visit (HOSPITAL_COMMUNITY): Payer: 59

## 2014-03-24 ENCOUNTER — Ambulatory Visit (HOSPITAL_COMMUNITY)
Admission: RE | Admit: 2014-03-24 | Discharge: 2014-03-24 | Disposition: A | Discharge status: Home / Self Care | Payer: 59 | Source: Ambulatory Visit | Attending: Cardiovascular Disease | Admitting: Cardiovascular Disease

## 2014-03-24 DIAGNOSIS — I10 Essential (primary) hypertension: Secondary | ICD-10-CM | POA: Diagnosis not present

## 2014-03-24 DIAGNOSIS — E785 Hyperlipidemia, unspecified: Secondary | ICD-10-CM | POA: Diagnosis not present

## 2014-03-24 DIAGNOSIS — Z8249 Family history of ischemic heart disease and other diseases of the circulatory system: Secondary | ICD-10-CM | POA: Diagnosis not present

## 2014-03-24 DIAGNOSIS — R0602 Shortness of breath: Secondary | ICD-10-CM | POA: Diagnosis present

## 2014-03-24 DIAGNOSIS — I340 Nonrheumatic mitral (valve) insufficiency: Secondary | ICD-10-CM

## 2014-03-24 NOTE — Progress Notes (Signed)
2D Echocardiogram Complete.  03/24/2014   Jay Haskew Leon, RDCS

## 2014-05-09 DIAGNOSIS — H02831 Dermatochalasis of right upper eyelid: Secondary | ICD-10-CM | POA: Insufficient documentation

## 2014-05-09 DIAGNOSIS — H02834 Dermatochalasis of left upper eyelid: Secondary | ICD-10-CM | POA: Insufficient documentation

## 2014-06-28 ENCOUNTER — Encounter: Payer: Self-pay | Admitting: Gastroenterology

## 2014-09-21 ENCOUNTER — Other Ambulatory Visit: Payer: Self-pay | Admitting: Internal Medicine

## 2014-09-22 ENCOUNTER — Other Ambulatory Visit: Payer: Self-pay | Admitting: Internal Medicine

## 2014-10-17 DIAGNOSIS — R220 Localized swelling, mass and lump, head: Secondary | ICD-10-CM | POA: Insufficient documentation

## 2014-10-27 ENCOUNTER — Ambulatory Visit (INDEPENDENT_AMBULATORY_CARE_PROVIDER_SITE_OTHER): Payer: 59 | Admitting: Internal Medicine

## 2014-10-27 ENCOUNTER — Encounter: Payer: Self-pay | Admitting: Internal Medicine

## 2014-10-27 VITALS — BP 106/64 | HR 78 | Temp 98.0°F | Ht 62.0 in | Wt 189.0 lb

## 2014-10-27 DIAGNOSIS — M546 Pain in thoracic spine: Secondary | ICD-10-CM

## 2014-10-27 DIAGNOSIS — I10 Essential (primary) hypertension: Secondary | ICD-10-CM | POA: Diagnosis not present

## 2014-10-27 DIAGNOSIS — M549 Dorsalgia, unspecified: Secondary | ICD-10-CM | POA: Insufficient documentation

## 2014-10-27 DIAGNOSIS — M25511 Pain in right shoulder: Secondary | ICD-10-CM | POA: Diagnosis not present

## 2014-10-27 MED ORDER — TRAMADOL HCL 50 MG PO TABS: 50.0000 mg | ORAL_TABLET | Freq: Four times a day (QID) | ORAL | Status: DC | PRN

## 2014-10-27 MED ORDER — KETOROLAC TROMETHAMINE 30 MG/ML IM SOLN
30.0000 mg | Freq: Once | INTRAMUSCULAR | Status: AC
  Administered 2014-10-27: 30 mg via INTRAMUSCULAR

## 2014-10-27 MED ORDER — CYCLOBENZAPRINE HCL 5 MG PO TABS
5.0000 mg | ORAL_TABLET | Freq: Three times a day (TID) | ORAL | Status: DC | PRN
Start: 2014-10-27 — End: 2015-12-22

## 2014-10-27 NOTE — Progress Notes (Signed)
Pre visit review using our clinic review tool, if applicable. No additional management support is needed unless otherwise documented below in the visit note. 

## 2014-10-27 NOTE — Addendum Note (Signed)
Addended by: Lyman Bishop on: 10/27/2014 05:41 PM   Modules accepted: Orders

## 2014-10-27 NOTE — Assessment & Plan Note (Signed)
Mild to mod, c/w msk strain, for toradol IM, tramadol prn and flexeril prn as well,  to f/u any worsening symptoms or concerns, will hold on imaging for now

## 2014-10-27 NOTE — Patient Instructions (Addendum)
You had the pain shot today (toradol)  Please take all new medication as prescribed - the tramadol, and muscle relaxer as needed  Please continue all other medications as before  Please have the pharmacy call with any other refills you may need.  Please keep your appointments with your specialists as you may have planned

## 2014-10-27 NOTE — Assessment & Plan Note (Signed)
stable overall by history and exam, recent data reviewed with pt, and pt to continue medical treatment as before,  to f/u any worsening symptoms or concerns BP Readings from Last 3 Encounters:  10/27/14 106/64  03/07/14 116/86  12/08/13 128/90

## 2014-10-27 NOTE — Assessment & Plan Note (Signed)
Mild acute on chronic, exam benign, ok to follow for any worsening, for tx as  above
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
## 2014-11-25 ENCOUNTER — Other Ambulatory Visit: Payer: Self-pay | Admitting: Internal Medicine

## 2014-12-06 ENCOUNTER — Other Ambulatory Visit: Payer: Self-pay | Admitting: Internal Medicine

## 2014-12-08 ENCOUNTER — Other Ambulatory Visit: Payer: 59

## 2014-12-08 ENCOUNTER — Other Ambulatory Visit (INDEPENDENT_AMBULATORY_CARE_PROVIDER_SITE_OTHER): Payer: 59

## 2014-12-08 DIAGNOSIS — Z0000 Encounter for general adult medical examination without abnormal findings: Secondary | ICD-10-CM | POA: Diagnosis not present

## 2014-12-08 LAB — CBC WITH DIFFERENTIAL/PLATELET
BASOS ABS: 0 10*3/uL (ref 0.0–0.1)
Basophils Relative: 0.4 % (ref 0.0–3.0)
Eosinophils Absolute: 0.5 10*3/uL (ref 0.0–0.7)
Eosinophils Relative: 8.3 % — ABNORMAL HIGH (ref 0.0–5.0)
HEMATOCRIT: 44 % (ref 36.0–46.0)
Hemoglobin: 14.2 g/dL (ref 12.0–15.0)
LYMPHS PCT: 35.5 % (ref 12.0–46.0)
Lymphs Abs: 2.3 10*3/uL (ref 0.7–4.0)
MCHC: 32.2 g/dL (ref 30.0–36.0)
MCV: 90.5 fl (ref 78.0–100.0)
MONOS PCT: 7.7 % (ref 3.0–12.0)
Monocytes Absolute: 0.5 10*3/uL (ref 0.1–1.0)
Neutro Abs: 3.1 10*3/uL (ref 1.4–7.7)
Neutrophils Relative %: 48.1 % (ref 43.0–77.0)
Platelets: 314 10*3/uL (ref 150.0–400.0)
RBC: 4.86 Mil/uL (ref 3.87–5.11)
RDW: 15.2 % (ref 11.5–15.5)
WBC: 6.4 10*3/uL (ref 4.0–10.5)

## 2014-12-08 LAB — URINALYSIS, ROUTINE W REFLEX MICROSCOPIC
Bilirubin Urine: NEGATIVE
KETONES UR: NEGATIVE
LEUKOCYTES UA: NEGATIVE
NITRITE: NEGATIVE
Specific Gravity, Urine: 1.02 (ref 1.000–1.030)
TOTAL PROTEIN, URINE-UPE24: NEGATIVE
URINE GLUCOSE: NEGATIVE
UROBILINOGEN UA: 0.2 (ref 0.0–1.0)
pH: 6 (ref 5.0–8.0)

## 2014-12-08 LAB — HEPATIC FUNCTION PANEL
ALBUMIN: 3.8 g/dL (ref 3.5–5.2)
ALT: 26 U/L (ref 0–35)
AST: 18 U/L (ref 0–37)
Alkaline Phosphatase: 122 U/L — ABNORMAL HIGH (ref 39–117)
Bilirubin, Direct: 0.1 mg/dL (ref 0.0–0.3)
TOTAL PROTEIN: 7.1 g/dL (ref 6.0–8.3)
Total Bilirubin: 0.5 mg/dL (ref 0.2–1.2)

## 2014-12-08 LAB — TSH: TSH: 4.77 u[IU]/mL — ABNORMAL HIGH (ref 0.35–4.50)

## 2014-12-08 LAB — LIPID PANEL
CHOLESTEROL: 181 mg/dL (ref 0–200)
HDL: 58.1 mg/dL (ref >39.00)
LDL Cholesterol: 110 mg/dL — ABNORMAL HIGH (ref 0–99)
NonHDL: 122.78
Total CHOL/HDL Ratio: 3
Triglycerides: 66 mg/dL (ref 0.0–149.0)
VLDL: 13.2 mg/dL (ref 0.0–40.0)

## 2014-12-08 LAB — BASIC METABOLIC PANEL
BUN: 14 mg/dL (ref 6–23)
CALCIUM: 9.4 mg/dL (ref 8.4–10.5)
CO2: 29 meq/L (ref 19–32)
CREATININE: 0.78 mg/dL (ref 0.40–1.20)
Chloride: 105 mEq/L (ref 96–112)
GFR: 98.16 mL/min (ref >60.00)
GLUCOSE: 94 mg/dL (ref 70–99)
Potassium: 4.1 mEq/L (ref 3.5–5.1)
Sodium: 142 mEq/L (ref 135–145)

## 2014-12-15 ENCOUNTER — Ambulatory Visit (INDEPENDENT_AMBULATORY_CARE_PROVIDER_SITE_OTHER): Payer: 59 | Admitting: Internal Medicine

## 2014-12-15 ENCOUNTER — Encounter: Payer: Self-pay | Admitting: Internal Medicine

## 2014-12-15 ENCOUNTER — Other Ambulatory Visit (INDEPENDENT_AMBULATORY_CARE_PROVIDER_SITE_OTHER): Payer: 59

## 2014-12-15 VITALS — BP 118/72 | HR 76 | Temp 97.8°F | Wt 190.0 lb

## 2014-12-15 DIAGNOSIS — Z20828 Contact with and (suspected) exposure to other viral communicable diseases: Secondary | ICD-10-CM | POA: Diagnosis not present

## 2014-12-15 DIAGNOSIS — Z23 Encounter for immunization: Secondary | ICD-10-CM | POA: Diagnosis not present

## 2014-12-15 DIAGNOSIS — R109 Unspecified abdominal pain: Secondary | ICD-10-CM | POA: Diagnosis not present

## 2014-12-15 DIAGNOSIS — E039 Hypothyroidism, unspecified: Secondary | ICD-10-CM | POA: Diagnosis not present

## 2014-12-15 DIAGNOSIS — M545 Low back pain, unspecified: Secondary | ICD-10-CM

## 2014-12-15 DIAGNOSIS — Z0000 Encounter for general adult medical examination without abnormal findings: Secondary | ICD-10-CM | POA: Diagnosis not present

## 2014-12-15 MED ORDER — ATORVASTATIN CALCIUM 40 MG PO TABS: 40.0000 mg | ORAL_TABLET | Freq: Every day | ORAL | Status: DC

## 2014-12-15 NOTE — Progress Notes (Signed)
Subjective:    Patient ID: [name] [name], female    DOB: 08-17-1960, 54 y.o.   MRN: [id number]  HPI  Here for wellness and f/u;  Overall doing ok;  Pt denies Chest pain, worsening SOB, DOE, wheezing, orthopnea, PND, worsening LE edema, palpitations, dizziness or syncope.  Pt denies neurological change such as new headache, facial or extremity weakness.  Pt denies polydipsia, polyuria, or low sugar symptoms. Pt states overall good compliance with treatment and medications, good tolerability, and has been trying to follow appropriate diet.  Pt denies worsening depressive symptoms, suicidal ideation or panic. No fever, night sweats, wt loss, loss of appetite, or other constitutional symptoms.  Pt states good ability with ADL's, has low fall risk, home safety reviewed and adequate, no other significant changes in hearing or vision, and only occasionally active with exercise. Pt continues to have recurring LBP without change in severity, bowel or bladder change, fever, wt loss,  worsening LE pain/numbness/weakness, gait change or falls.  Denies worsening reflux, dysphagia, n/v, bowel change or blood, butwith recent worssening 1 mo lower abd pain ? Etiology, last colonscopy 6 yrs, no other complaints Wt Readings from Last 3 Encounters:  12/15/14 190 lb (86.183 kg)  10/27/14 189 lb (85.73 kg)  03/07/14 195 lb 1.6 oz (88.497 kg)   Past Medical History  Diagnosis Date  . ANEMIA-NOS 12/08/2006  . BACK PAIN 12/08/2006  . BREAST HYPERTROPHY 02/05/2008  . DIVERTICULOSIS, COLON 04/18/2010  . GERD 12/08/2006  . HYPERLIPIDEMIA 12/08/2006  . HYPERTENSION 12/08/2006  . HYPOTHYROIDISM 02/05/2008  . LIPOMA 04/18/2010  . OVERWEIGHT/OBESITY 01/11/2010  . Shortness of breath 01/12/2009  . SHOULDER PAIN, RIGHT 03/09/2007  . URINALYSIS, ABNORMAL 11/23/2009  . URI 11/23/2009  . Mild mitral regurgitation by prior echocardiogram 02/07/2011   Past Surgical History  Procedure Laterality Date  . Breast reduction  surgery  2012  . Tonsillectomy and adenoidectomy      reports that she has never smoked. She does not have any smokeless tobacco history on file. She reports that she drinks alcohol. She reports that she does not use illicit drugs. family history includes Cancer in her cousin and other; Diabetes in her mother; Hypertension in her brother, mother, and sister; Stroke (age of onset: 8) in her mother. Allergies  Allergen Reactions  . Propoxyphene Hcl     *DARVOCET*    REACTION: nausea   Current Outpatient Prescriptions on File Prior to Visit  Medication Sig Dispense Refill  . aspirin 81 MG EC tablet Take 1 tablet (81 mg total) by mouth daily. Swallow whole. 30 tablet 12  . cyclobenzaprine (FLEXERIL) 5 MG tablet Take 1 tablet (5 mg total) by mouth 3 (three) times daily as needed for muscle spasms. 60 tablet 1  . KLOR-CON M10 10 MEQ tablet TAKE ONE TABLET BY MOUTH ONCE DAILY 90 tablet 1  . levothyroxine (SYNTHROID, LEVOTHROID) 50 MCG tablet Take 1 tablet (50 mcg total) by mouth daily. 90 tablet 3  . traMADol (ULTRAM) 50 MG tablet Take 1 tablet (50 mg total) by mouth every 6 (six) hours as needed. 60 tablet 1  . valsartan-hydrochlorothiazide (DIOVAN-HCT) 80-12.5 MG tablet TAKE ONE TABLET BY MOUTH ONCE DAILY 90 tablet 1   No current facility-administered medications on file prior to visit.   Review of Systems Constitutional: Negative for increased diaphoresis, other activity, appetite or siginficant weight change other than noted HENT: Negative for worsening hearing loss, ear pain, facial swelling, mouth sores and neck stiffness.   Eyes: Negative  for other worsening pain, redness or visual disturbance.  Respiratory: Negative for shortness of breath and wheezing  Cardiovascular: Negative for chest pain and palpitations.  Gastrointestinal: Negative for diarrhea, blood in stool, abdominal distention or other pain Genitourinary: Negative for hematuria, flank pain or change in urine volume.    Musculoskeletal: Negative for myalgias or other joint complaints.  Skin: Negative for color change and wound or drainage.  Neurological: Negative for syncope and numbness. other than noted Hematological: Negative for adenopathy. or other swelling Psychiatric/Behavioral: Negative for hallucinations, SI, self-injury, decreased concentration or other worsening agitation.      Objective:   Physical Exam BP 118/72 mmHg  Pulse 76  Temp(Src) 97.8 F (36.6 C)  Wt 190 lb (86.183 kg)  SpO2 98% VS noted,  Constitutional: Pt is oriented to person, place, and time. Appears well-developed and well-nourished, in no significant distress Head: Normocephalic and atraumatic.  Right Ear: External ear normal.  Left Ear: External ear normal.  Nose: Nose normal.  Mouth/Throat: Oropharynx is clear and moist.  Eyes: Conjunctivae and EOM are normal. Pupils are equal, round, and reactive to light.  Neck: Normal range of motion. Neck supple. No JVD present. No tracheal deviation present or significant neck LA or mass Cardiovascular: Normal rate, regular rhythm, normal heart sounds and intact distal pulses.   Pulmonary/Chest: Effort normal and breath sounds without rales or wheezing  Abdominal: Soft. Bowel sounds are normal. NT. No HSM  Musculoskeletal: Normal range of motion. Exhibits no edema.  Lymphadenopathy:  Has no cervical adenopathy.  Neurological: Pt is alert and oriented to person, place, and time. Pt has normal reflexes. No cranial nerve deficit. Motor grossly intact Skin: Skin is warm and dry. No rash noted.  Psychiatric:  Has normal mood and affect. Behavior is normal.     Assessment & Plan:

## 2014-12-15 NOTE — Patient Instructions (Addendum)
You had the flu shot today  Please continue all other medications as before, and refills have been done if requested.  Please have the pharmacy call with any other refills you may need.  Please continue your efforts at being more active, low cholesterol diet, and weight control.  You are otherwise up to date with prevention measures today.  Please keep your appointments with your specialists as you may have planned  You will be contacted regarding the referral for: Dr Smith/sports medicine, and Gastroenterology  Please return in 6 months, or sooner if needed

## 2014-12-15 NOTE — Progress Notes (Signed)
Patient received education resource, including the self-management goal and tool. Patient verbalized understanding. 

## 2014-12-15 NOTE — Assessment & Plan Note (Signed)

## 2014-12-16 LAB — HEPATITIS C ANTIBODY: HCV Ab: NEGATIVE

## 2014-12-16 LAB — T4, FREE: Free T4: 0.83 ng/dL (ref 0.60–1.60)

## 2014-12-17 DIAGNOSIS — M545 Low back pain, unspecified: Secondary | ICD-10-CM | POA: Insufficient documentation

## 2014-12-17 DIAGNOSIS — R109 Unspecified abdominal pain: Secondary | ICD-10-CM | POA: Insufficient documentation

## 2014-12-17 NOTE — Assessment & Plan Note (Signed)
stable overall by history and exam, recent data reviewed with pt, and pt to continue medical treatment as before,  to f/u any worsening symptoms or concerns Lab Results  Component Value Date   TSH 4.77* 12/08/2014

## 2014-12-17 NOTE — Assessment & Plan Note (Signed)
Etiology unclear, for GI referral per pt request

## 2014-12-17 NOTE — Assessment & Plan Note (Signed)
For pain control, refer sport med per pt request

## 2014-12-25 ENCOUNTER — Other Ambulatory Visit: Payer: Self-pay | Admitting: Internal Medicine

## 2014-12-26 ENCOUNTER — Other Ambulatory Visit: Payer: Self-pay | Admitting: *Deleted

## 2014-12-26 NOTE — Telephone Encounter (Signed)
Receive call pt is checking status on refill for her thyroid medication. Inform pt refill was sent back to [facility] this am.../lmb

## 2014-12-28 ENCOUNTER — Ambulatory Visit: Payer: 59 | Admitting: Family Medicine

## 2015-01-03 ENCOUNTER — Encounter: Payer: Self-pay | Admitting: Internal Medicine

## 2015-01-10 ENCOUNTER — Ambulatory Visit: Payer: 59 | Admitting: Family Medicine

## 2015-07-16 ENCOUNTER — Other Ambulatory Visit: Payer: Self-pay | Admitting: Internal Medicine

## 2015-12-12 ENCOUNTER — Telehealth: Payer: Self-pay | Admitting: Internal Medicine

## 2015-12-12 DIAGNOSIS — Z0000 Encounter for general adult medical examination without abnormal findings: Secondary | ICD-10-CM

## 2015-12-12 NOTE — Telephone Encounter (Signed)
Patient has appt for CPE on 25th.  Is requesting labs to be entered before appt.

## 2015-12-18 ENCOUNTER — Other Ambulatory Visit: Payer: Self-pay | Admitting: Internal Medicine

## 2015-12-18 ENCOUNTER — Other Ambulatory Visit (INDEPENDENT_AMBULATORY_CARE_PROVIDER_SITE_OTHER): Payer: 59

## 2015-12-18 DIAGNOSIS — Z0000 Encounter for general adult medical examination without abnormal findings: Secondary | ICD-10-CM

## 2015-12-18 LAB — CBC
HEMATOCRIT: 42.5 % (ref 36.0–46.0)
HEMOGLOBIN: 14.3 g/dL (ref 12.0–15.0)
MCHC: 33.6 g/dL (ref 30.0–36.0)
MCV: 88.3 fl (ref 78.0–100.0)
PLATELETS: 354 10*3/uL (ref 150.0–400.0)
RBC: 4.82 Mil/uL (ref 3.87–5.11)
RDW: 13.6 % (ref 11.5–15.5)
WBC: 5.1 10*3/uL (ref 4.0–10.5)

## 2015-12-18 LAB — LIPID PANEL
CHOL/HDL RATIO: 4
Cholesterol: 173 mg/dL (ref 0–200)
HDL: 41.2 mg/dL (ref >39.00)
LDL CALC: 114 mg/dL — AB (ref 0–99)
NONHDL: 131.43
TRIGLYCERIDES: 88 mg/dL (ref 0.0–149.0)
VLDL: 17.6 mg/dL (ref 0.0–40.0)

## 2015-12-18 LAB — BASIC METABOLIC PANEL
BUN: 16 mg/dL (ref 6–23)
CALCIUM: 9.1 mg/dL (ref 8.4–10.5)
CO2: 28 mEq/L (ref 19–32)
CREATININE: 0.77 mg/dL (ref 0.40–1.20)
Chloride: 104 mEq/L (ref 96–112)
GFR: 99.27 mL/min (ref >60.00)
Glucose, Bld: 105 mg/dL — ABNORMAL HIGH (ref 70–99)
Potassium: 3.2 mEq/L — ABNORMAL LOW (ref 3.5–5.1)
Sodium: 142 mEq/L (ref 135–145)

## 2015-12-18 LAB — MICROALBUMIN / CREATININE URINE RATIO
MICROALB/CREAT RATIO: 2.4 mg/g (ref 0.0–30.0)
Microalb, Ur: 10 mg/dL — ABNORMAL HIGH (ref 0.0–1.9)

## 2015-12-18 LAB — HEPATIC FUNCTION PANEL
ALT: 18 U/L (ref 0–35)
AST: 16 U/L (ref 0–37)
Albumin: 4.1 g/dL (ref 3.5–5.2)
Alkaline Phosphatase: 92 U/L (ref 39–117)
BILIRUBIN DIRECT: 0.1 mg/dL (ref 0.0–0.3)
BILIRUBIN TOTAL: 0.4 mg/dL (ref 0.2–1.2)
Total Protein: 7.3 g/dL (ref 6.0–8.3)

## 2015-12-18 LAB — HEMOGLOBIN A1C: Hgb A1c MFr Bld: 5.8 % (ref 4.6–6.5)

## 2015-12-18 LAB — TSH: TSH: 2.95 u[IU]/mL (ref 0.35–4.50)

## 2015-12-20 ENCOUNTER — Encounter: Payer: 59 | Admitting: Internal Medicine

## 2015-12-22 ENCOUNTER — Encounter: Payer: Self-pay | Admitting: Internal Medicine

## 2015-12-22 ENCOUNTER — Ambulatory Visit (INDEPENDENT_AMBULATORY_CARE_PROVIDER_SITE_OTHER): Payer: 59 | Admitting: Internal Medicine

## 2015-12-22 VITALS — BP 138/78 | HR 87 | Temp 98.7°F | Resp 20 | Wt 196.2 lb

## 2015-12-22 DIAGNOSIS — K589 Irritable bowel syndrome without diarrhea: Secondary | ICD-10-CM | POA: Insufficient documentation

## 2015-12-22 DIAGNOSIS — I10 Essential (primary) hypertension: Secondary | ICD-10-CM

## 2015-12-22 DIAGNOSIS — E785 Hyperlipidemia, unspecified: Secondary | ICD-10-CM

## 2015-12-22 DIAGNOSIS — I340 Nonrheumatic mitral (valve) insufficiency: Secondary | ICD-10-CM | POA: Diagnosis not present

## 2015-12-22 DIAGNOSIS — Z0001 Encounter for general adult medical examination with abnormal findings: Secondary | ICD-10-CM

## 2015-12-22 DIAGNOSIS — K580 Irritable bowel syndrome with diarrhea: Secondary | ICD-10-CM | POA: Diagnosis not present

## 2015-12-22 DIAGNOSIS — F4320 Adjustment disorder, unspecified: Secondary | ICD-10-CM | POA: Diagnosis not present

## 2015-12-22 DIAGNOSIS — E039 Hypothyroidism, unspecified: Secondary | ICD-10-CM

## 2015-12-22 DIAGNOSIS — F4321 Adjustment disorder with depressed mood: Secondary | ICD-10-CM

## 2015-12-22 MED ORDER — ROSUVASTATIN CALCIUM 10 MG PO TABS
10.0000 mg | ORAL_TABLET | Freq: Every day | ORAL | 3 refills | Status: DC
Start: 2015-12-22 — End: 2016-06-24

## 2015-12-22 MED ORDER — ELUXADOLINE 75 MG PO TABS: 75.0000 mg | ORAL_TABLET | Freq: Two times a day (BID) | ORAL | 5 refills | Status: DC

## 2015-12-22 MED ORDER — LEVOTHYROXINE SODIUM 50 MCG PO TABS: 50.0000 ug | ORAL_TABLET | Freq: Every day | ORAL | 3 refills | Status: DC

## 2015-12-22 MED ORDER — VALSARTAN-HYDROCHLOROTHIAZIDE 80-12.5 MG PO TABS: 1.0000 | ORAL_TABLET | Freq: Every day | ORAL | 3 refills | Status: DC

## 2015-12-22 MED ORDER — POTASSIUM CHLORIDE CRYS ER 10 MEQ PO TBCR: 10.0000 meq | EXTENDED_RELEASE_TABLET | Freq: Every day | ORAL | 3 refills | Status: DC

## 2015-12-22 NOTE — Patient Instructions (Addendum)
Please take all new medication as prescribed - the crestor 10 mg per day, and the Viberzi  Please continue all other medications as before, and refills have been done if requested - including restart of the potassium and Blood Pressure pills.  Please have the pharmacy call with any other refills you may need.  Please continue your efforts at being more active, low cholesterol diet, and weight control.  You are otherwise up to date with prevention measures today.  Please keep your appointments with your specialists as you may have planned  You will be contacted regarding the referral for: counseling, and follow up with cardiology  Please return in 6 months, or sooner if needed, with Lab testing done 3-5 days before

## 2015-12-22 NOTE — Progress Notes (Signed)
Subjective:    Patient ID: [name] [name], female    DOB: 12-05-1960, 55 y.o.   MRN: [id number]  HPI     Here for wellness and f/u;  Overall doing ok;  Pt denies Chest pain, worsening SOB, DOE, wheezing, orthopnea, PND, worsening LE edema, palpitations, dizziness or syncope.  Pt denies neurological change such as new headache, facial or extremity weakness.  Pt denies polydipsia, polyuria, or low sugar symptoms. Pt states overall good compliance with treatment and medications, good tolerability, and has been trying to follow appropriate diet.  Pt denies worsening depressive symptoms, suicidal ideation or panic, but has had marked grieving in last 2 wks after family stressors. No fever, night sweats, wt loss, loss of appetite, or other constitutional symptoms.  Pt states good ability with ADL's, has low fall risk, home safety reviewed and adequate, no other significant changes in hearing or vision, and only occasionally active with exercise. Willing to restart (maybe). Ran out of BP med x  Days, has not been taking the K supp but willing to restart.  Denies worsening reflux, abd pain, dysphagia, n/v, or blood  But has had persistent IBS with diarrheal stools ongoing for several months.   Past Medical History:  Diagnosis Date  . ANEMIA-NOS 12/08/2006  . BACK PAIN 12/08/2006  . BREAST HYPERTROPHY 02/05/2008  . DIVERTICULOSIS, COLON 04/18/2010  . GERD 12/08/2006  . HYPERLIPIDEMIA 12/08/2006  . HYPERTENSION 12/08/2006  . HYPOTHYROIDISM 02/05/2008  . LIPOMA 04/18/2010  . Mild mitral regurgitation by prior echocardiogram 02/07/2011  . OVERWEIGHT/OBESITY 01/11/2010  . Shortness of breath 01/12/2009  . SHOULDER PAIN, RIGHT 03/09/2007  . URI 11/23/2009  . URINALYSIS, ABNORMAL 11/23/2009   Past Surgical History:  Procedure Laterality Date  . BREAST REDUCTION SURGERY  2012  . TONSILLECTOMY AND ADENOIDECTOMY      reports that she has never smoked. She does not have any smokeless tobacco history on file.  She reports that she drinks alcohol. She reports that she does not use drugs. family history includes Cancer in her cousin and other; Diabetes in her mother; Hypertension in her brother, mother, and sister; Stroke (age of onset: 42) in her mother. Allergies  Allergen Reactions  . Propoxyphene Hcl     *DARVOCET*    REACTION: nausea   Current Outpatient Prescriptions on File Prior to Visit  Medication Sig Dispense Refill  . aspirin 81 MG EC tablet Take 1 tablet (81 mg total) by mouth daily. Swallow whole. 30 tablet 12   No current facility-administered medications on file prior to visit.    Review of Systems Constitutional: Negative for increased diaphoresis, or other activity, appetite or siginficant weight change other than noted HENT: Negative for worsening hearing loss, ear pain, facial swelling, mouth sores and neck stiffness.   Eyes: Negative for other worsening pain, redness or visual disturbance.  Respiratory: Negative for choking or stridor Cardiovascular: Negative for other chest pain and palpitations.  Gastrointestinal: Negative for worsening diarrhea, blood in stool, or abdominal distention Genitourinary: Negative for hematuria, flank pain or change in urine volume.  Musculoskeletal: Negative for myalgias or other joint complaints.  Skin: Negative for other color change and wound or drainage.  Neurological: Negative for syncope and numbness. other than noted Hematological: Negative for adenopathy. or other swelling Psychiatric/Behavioral: Negative for hallucinations, SI, self-injury, decreased concentration or other worsening agitation.  All other systems neg per pt    Objective:   Physical Exam BP 138/78   Pulse 87   Temp 98.7 F (  37.1 C) (Oral)   Resp 20   Wt 196 lb 4 oz (89 kg)   SpO2 98%   BMI 35.89 kg/m  VS noted,  Constitutional: Pt is oriented to person, place, and time. Appears well-developed and well-nourished, in no significant distress Head: Normocephalic  and atraumatic  Eyes: Conjunctivae and EOM are normal. Pupils are equal, round, and reactive to light Right Ear: External ear normal.  Left Ear: External ear normal Nose: Nose normal.  Mouth/Throat: Oropharynx is clear and moist  Neck: Normal range of motion. Neck supple. No JVD present. No tracheal deviation present or significant neck LA or mass Cardiovascular: Normal rate, regular rhythm, normal heart sounds and intact distal pulses.   Pulmonary/Chest: Effort normal and breath sounds without rales or wheezing  Abdominal: Soft. Bowel sounds are normal. NT. No HSM  Musculoskeletal: Normal range of motion. Exhibits no edema Lymphadenopathy: Has no cervical adenopathy.  Neurological: Pt is alert and oriented to person, place, and time. Pt has normal reflexes. No cranial nerve deficit. Motor grossly intact Skin: Skin is warm and dry. No rash noted or new ulcers Psychiatric:  Has dysphoric mood and affect. Behavior is normal.     Assessment & Plan:

## 2015-12-22 NOTE — Progress Notes (Signed)
Pre visit review using our clinic review tool, if applicable. No additional management support is needed unless otherwise documented below in the visit note. 

## 2015-12-23 NOTE — Assessment & Plan Note (Signed)
stable overall by history and exam, recent data reviewed with pt, and pt to continue medical treatment as before,  to f/u any worsening symptoms or concerns Lab Results  Component Value Date   TSH 2.95 12/18/2015

## 2015-12-23 NOTE — Assessment & Plan Note (Signed)
Due for f/u per last card note, will refer

## 2015-12-23 NOTE — Assessment & Plan Note (Signed)
Uncontrolled, for crestor 10 qd, low chol diet,  to f/u any worsening symptoms or concerns

## 2015-12-23 NOTE — Assessment & Plan Note (Addendum)
Mild to mod, declines med tx but or counseling referral,  to f/u any worsening symptoms or concerns  In addition to the time spent performing CPE, I spent an additional 25 minutes face to face,in which greater than 50% of this time was spent in counseling and coordination of care for patient's acute illness as documented.

## 2015-12-23 NOTE — Assessment & Plan Note (Signed)
stable overall by history and exam, recent data reviewed with pt, and pt to continue medical treatment as before,  to f/u any worsening symptoms or concerns BP Readings from Last 3 Encounters:  12/22/15 138/78  12/15/14 118/72  10/27/14 106/64

## 2015-12-23 NOTE — Assessment & Plan Note (Signed)

## 2015-12-23 NOTE — Assessment & Plan Note (Signed)
Mild to mod, for viberzi asd  to f/u any worsening symptoms or concerns

## 2016-01-04 ENCOUNTER — Encounter: Payer: Self-pay | Admitting: Internal Medicine

## 2016-02-12 ENCOUNTER — Ambulatory Visit (INDEPENDENT_AMBULATORY_CARE_PROVIDER_SITE_OTHER): Payer: 59 | Admitting: Cardiology

## 2016-02-12 ENCOUNTER — Encounter: Payer: Self-pay | Admitting: Cardiology

## 2016-02-12 VITALS — BP 118/78 | HR 77 | Ht 62.0 in | Wt 194.4 lb

## 2016-02-12 DIAGNOSIS — I10 Essential (primary) hypertension: Secondary | ICD-10-CM

## 2016-02-12 DIAGNOSIS — R0789 Other chest pain: Secondary | ICD-10-CM | POA: Diagnosis not present

## 2016-02-12 DIAGNOSIS — E7800 Pure hypercholesterolemia, unspecified: Secondary | ICD-10-CM | POA: Diagnosis not present

## 2016-02-12 NOTE — Patient Instructions (Signed)
Medication Instructions:  The current medical regimen is effective;  continue present plan and medications.  Follow-Up: Follow up as needed.  Thank you for choosing [facility]!!     

## 2016-02-12 NOTE — Progress Notes (Signed)
Cardiology Office Note    Date:  02/12/2016   ID:  [name] [name], DOB 1960-08-10, MRN [id number]  PCP:  Cathlean Cower, MD  Cardiologist:   Candee Furbish, MD     History of Present Illness:  [name] [name] is a 55 y.o. female here for follow-up of mild mitral regurgitation at the request of Dr. Jenny Reichmann. She was last seen by Dr. Percival Spanish on 03/07/14 and prior to that, Dr. Verl Blalock.  In review previous notes, she has had chronic dyspnea with activities but not particularly changed over the last several years. No orthopnea, no syncope.  Occasionally she may have left upper chest wall discomfort that is fairly pinpoint in localization and sharp in character. Likely musculoskeletal.    Past Medical History:  Diagnosis Date  . ANEMIA-NOS 12/08/2006  . BACK PAIN 12/08/2006  . BREAST HYPERTROPHY 02/05/2008  . DIVERTICULOSIS, COLON 04/18/2010  . GERD 12/08/2006  . HYPERLIPIDEMIA 12/08/2006  . HYPERTENSION 12/08/2006  . HYPOTHYROIDISM 02/05/2008  . LIPOMA 04/18/2010  . Mild mitral regurgitation by prior echocardiogram 02/07/2011  . OVERWEIGHT/OBESITY 01/11/2010  . Shortness of breath 01/12/2009  . SHOULDER PAIN, RIGHT 03/09/2007  . URI 11/23/2009  . URINALYSIS, ABNORMAL 11/23/2009    Past Surgical History:  Procedure Laterality Date  . BREAST REDUCTION SURGERY  2012  . TONSILLECTOMY AND ADENOIDECTOMY      Current Medications: Outpatient Medications Prior to Visit  Medication Sig Dispense Refill  . aspirin 81 MG EC tablet Take 1 tablet (81 mg total) by mouth daily. Swallow whole. 30 tablet 12  . levothyroxine (SYNTHROID, LEVOTHROID) 50 MCG tablet Take 1 tablet (50 mcg total) by mouth daily. 90 tablet 3  . potassium chloride (KLOR-CON M10) 10 MEQ tablet Take 1 tablet (10 mEq total) by mouth daily. 90 tablet 3  . rosuvastatin (CRESTOR) 10 MG tablet Take 1 tablet (10 mg total) by mouth daily. 90 tablet 3  . valsartan-hydrochlorothiazide (DIOVAN-HCT) 80-12.5 MG tablet Take 1 tablet by mouth  daily. 90 tablet 3  . Eluxadoline (VIBERZI) 75 MG TABS Take 75 mg by mouth 2 (two) times daily. 60 tablet 5   No facility-administered medications prior to visit.      Allergies:   Propoxyphene hcl   Social History   Social History  . Marital status: Married    Spouse name: N/A  . Number of children: 2  . Years of education: N/A   Occupational History  . 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 Smoking status: Never Smoker  . Smokeless tobacco: None  . Alcohol use Yes     Comment: rare  . Drug use: No  . Sexual activity: Not Asked   Other Topics Concern  . None   Social History Narrative   Lives at home with husband and son.      Family History:  The patient's family history includes Cancer in her cousin and other; Diabetes in her mother; Hypertension in her brother, mother, and sister; Stroke (age of onset: 79) in her mother.   ROS:   Please see the history of present illness.    ROS All other systems reviewed and are negative.   PHYSICAL EXAM:   VS:  BP 118/78   Pulse 77   Ht 5\' 2"  (1.575 m)   Wt 194 lb 6.4 oz (88.2 kg)   LMP  (LMP Unknown)   BMI 35.56 kg/m    GEN: Well nourished, well developed,  in no acute distress  HEENT: normal  Neck: no JVD, carotid bruits, or masses Cardiac: RRR; no murmurs, rubs, or gallops,no edema  Respiratory:  clear to auscultation bilaterally, normal work of breathing GI: soft, nontender, nondistended, + BS MS: no deformity or atrophy  Skin: warm and dry, no rash Neuro:  Alert and Oriented x 3, Strength and sensation are intact Psych: euthymic mood, full affect  Wt Readings from Last 3 Encounters:  02/12/16 194 lb 6.4 oz (88.2 kg)  12/22/15 196 lb 4 oz (89 kg)  12/15/14 190 lb (86.2 kg)      Studies/Labs Reviewed:   EKG:  EKG is ordered today.  The ekg ordered today demonstrates Sinus rhythm 77 with nonspecific ST-T wave flattening. Overall reassuring, personally  viewed  Recent Labs: 12/18/2015: ALT 18; BUN 16; Creatinine, Ser 0.77; Hemoglobin 14.3; Platelets 354.0; Potassium 3.2; Sodium 142; TSH 2.95   Lipid Panel    Component Value Date/Time   CHOL 173 12/18/2015 0835   TRIG 88.0 12/18/2015 0835   TRIG 63 02/20/2006 1102   HDL 41.20 12/18/2015 0835   CHOLHDL 4 12/18/2015 0835   VLDL 17.6 12/18/2015 0835   LDLCALC 114 (H) 12/18/2015 0835    Additional studies/ records that were reviewed today include:   ECHO 03/24/14:  - Left ventricle: The cavity size was normal. Systolic function was normal. The estimated ejection fraction was in the range of 50% to 55%. Wall motion was normal; there were no regional wall motion abnormalities. Left ventricular diastolic function parameters were normal. - Aortic valve: Trileaflet; normal thickness leaflets. There was no regurgitation. - Aortic root: The aortic root was normal in size. - Mitral valve: Structurally normal valve. There was no regurgitation. - Right ventricle: Systolic function was normal. - Right atrium: The atrium was normal in size. - Tricuspid valve: There was no regurgitation. - Pulmonary arteries: Systolic pressure couldn&'t be assessed as there was no tricuspid regurgitation. - Inferior vena cava: The vessel was normal in size. The respirophasic diameter changes were in the normal range (= 50%), consistent with normal central venous pressure. - Pericardium, extracardiac: There was no pericardial effusion.  Impressions:  - Normal study.    ASSESSMENT:    1. Essential hypertension   2. Pure hypercholesterolemia   3. Atypical chest pain      PLAN:  In order of problems listed above:  Mitral regurgitation  - Last echocardiogram 2016 showed normal mitral valve function. No regurgitation.  Essential hypertension excellent control by Dr. Cathlean Cower  -  medications reviewed   Hyperlipidemia  - On Crestor, diet modification. Per Dr.  Jenny Reichmann.  Atypical chest pain  - Sharp at times, localized, likely musculoskeletal. If symptoms become worse or more worrisome, we could consider further cardiac testing. At this point, reassurance.  Obesity   - continue to encourage weight loss.  We will see back on as-needed basis.  Medication Adjustments/Labs and Tests Ordered: Current medicines are reviewed at length with the patient today.  Concerns regarding medicines are outlined above.  Medication changes, Labs and Tests ordered today are listed in the Patient Instructions below. Patient Instructions  Medication Instructions:  The current medical regimen is effective;  continue present plan and medications.  Follow-Up: Follow up as needed.  Thank you for choosing [facility]!!        Signed, Candee Furbish, MD  02/12/2016 4:37 PM    [facility], [place], [place]  [address] Phone: [phone];  Fax: [phone]  

## 2016-02-13 NOTE — Addendum Note (Signed)
Addended by: Domenica Reamer R on: 02/13/2016 01:32 PM   Modules accepted: Orders

## 2016-04-12 ENCOUNTER — Other Ambulatory Visit: Payer: Self-pay | Admitting: *Deleted

## 2016-04-12 MED ORDER — LEVOTHYROXINE SODIUM 50 MCG PO TABS: 50.0000 ug | ORAL_TABLET | Freq: Every day | ORAL | 2 refills | Status: DC

## 2016-04-16 DIAGNOSIS — Z803 Family history of malignant neoplasm of breast: Secondary | ICD-10-CM | POA: Diagnosis not present

## 2016-04-16 DIAGNOSIS — Z1231 Encounter for screening mammogram for malignant neoplasm of breast: Secondary | ICD-10-CM | POA: Diagnosis not present

## 2016-06-19 ENCOUNTER — Other Ambulatory Visit (INDEPENDENT_AMBULATORY_CARE_PROVIDER_SITE_OTHER): Payer: 59

## 2016-06-19 DIAGNOSIS — E785 Hyperlipidemia, unspecified: Secondary | ICD-10-CM

## 2016-06-19 LAB — BASIC METABOLIC PANEL
BUN: 14 mg/dL (ref 6–23)
CALCIUM: 9.3 mg/dL (ref 8.4–10.5)
CO2: 29 mEq/L (ref 19–32)
CREATININE: 0.78 mg/dL (ref 0.40–1.20)
Chloride: 106 mEq/L (ref 96–112)
GFR: 97.63 mL/min (ref >60.00)
Glucose, Bld: 92 mg/dL (ref 70–99)
Potassium: 3.7 mEq/L (ref 3.5–5.1)
Sodium: 142 mEq/L (ref 135–145)

## 2016-06-19 LAB — HEPATIC FUNCTION PANEL
ALBUMIN: 4.1 g/dL (ref 3.5–5.2)
ALK PHOS: 108 U/L (ref 39–117)
ALT: 18 U/L (ref 0–35)
AST: 16 U/L (ref 0–37)
BILIRUBIN DIRECT: 0.1 mg/dL (ref 0.0–0.3)
Total Bilirubin: 0.5 mg/dL (ref 0.2–1.2)
Total Protein: 7.1 g/dL (ref 6.0–8.3)

## 2016-06-19 LAB — LIPID PANEL
Cholesterol: 162 mg/dL (ref 0–200)
HDL: 60.2 mg/dL (ref >39.00)
LDL Cholesterol: 84 mg/dL (ref 0–99)
NONHDL: 101.47
TRIGLYCERIDES: 85 mg/dL (ref 0.0–149.0)
Total CHOL/HDL Ratio: 3
VLDL: 17 mg/dL (ref 0.0–40.0)

## 2016-06-19 LAB — TSH: TSH: 2.63 u[IU]/mL (ref 0.35–4.50)

## 2016-06-21 ENCOUNTER — Ambulatory Visit (INDEPENDENT_AMBULATORY_CARE_PROVIDER_SITE_OTHER)
Admission: RE | Admit: 2016-06-21 | Discharge: 2016-06-21 | Disposition: A | Discharge status: Home / Self Care | Payer: 59 | Source: Ambulatory Visit | Attending: Internal Medicine | Admitting: Internal Medicine

## 2016-06-21 ENCOUNTER — Ambulatory Visit (INDEPENDENT_AMBULATORY_CARE_PROVIDER_SITE_OTHER): Payer: 59 | Admitting: Internal Medicine

## 2016-06-21 ENCOUNTER — Encounter: Payer: Self-pay | Admitting: Internal Medicine

## 2016-06-21 VITALS — BP 110/70 | HR 81 | Ht 62.0 in | Wt 194.0 lb

## 2016-06-21 DIAGNOSIS — Z0000 Encounter for general adult medical examination without abnormal findings: Secondary | ICD-10-CM | POA: Diagnosis not present

## 2016-06-21 DIAGNOSIS — J309 Allergic rhinitis, unspecified: Secondary | ICD-10-CM | POA: Diagnosis not present

## 2016-06-21 DIAGNOSIS — R079 Chest pain, unspecified: Secondary | ICD-10-CM

## 2016-06-21 DIAGNOSIS — F419 Anxiety disorder, unspecified: Secondary | ICD-10-CM | POA: Insufficient documentation

## 2016-06-21 DIAGNOSIS — E785 Hyperlipidemia, unspecified: Secondary | ICD-10-CM | POA: Diagnosis not present

## 2016-06-21 MED ORDER — TRIAMCINOLONE ACETONIDE 55 MCG/ACT NA AERO: 2.0000 | INHALATION_SPRAY | Freq: Every day | NASAL | 12 refills | Status: DC

## 2016-06-21 NOTE — Progress Notes (Signed)
Subjective:    Patient ID: [name] [name], female    DOB: 1960/09/18, 56 y.o.   MRN: [id number]  HPI  Here to f/u; overall doing ok,  Pt denies increasing sob or doe, wheezing, orthopnea, PND, increased LE swelling, palpitations, dizziness or syncope, but has had bilat anterior costal margin pain to deep inspiration and bending forward x 2 wks. .  Pt denies new neurological symptoms such as new headache, or facial or extremity weakness or numbness.  Pt denies polydipsia, polyuria, or low sugar episode.   Pt denies new neurological symptoms such as new headache, or facial or extremity weakness or numbness.   Pt states overall good compliance with meds, mostly trying to follow appropriate diet, with wt overall stable,  but little exercise however.  Only takes the crestor a few days per wk. Does have several wks ongoing nasal allergy symptoms with clearish congestion, itch and sneezing, without fever, pain, ST, cough, swelling or wheezing. Denies worsening depressive symptoms, suicidal ideation, or panic; has ongoing anxiety, asks for counseling referral Past Medical History:  Diagnosis Date  . ANEMIA-NOS 12/08/2006  . BACK PAIN 12/08/2006  . BREAST HYPERTROPHY 02/05/2008  . DIVERTICULOSIS, COLON 04/18/2010  . GERD 12/08/2006  . HYPERLIPIDEMIA 12/08/2006  . HYPERTENSION 12/08/2006  . HYPOTHYROIDISM 02/05/2008  . LIPOMA 04/18/2010  . Mild mitral regurgitation by prior echocardiogram 02/07/2011  . OVERWEIGHT/OBESITY 01/11/2010  . Shortness of breath 01/12/2009  . SHOULDER PAIN, RIGHT 03/09/2007  . URI 11/23/2009  . URINALYSIS, ABNORMAL 11/23/2009   Past Surgical History:  Procedure Laterality Date  . BREAST REDUCTION SURGERY  2012  . TONSILLECTOMY AND ADENOIDECTOMY      reports that she has never smoked. She has never used smokeless tobacco. She reports that she drinks alcohol. She reports that she does not use drugs. family history includes Cancer in her cousin and other; Diabetes in her  mother; Hypertension in her brother, mother, and sister; Stroke (age of onset: 45) in her mother. Allergies  Allergen Reactions  . Propoxyphene Hcl     *DARVOCET*    REACTION: nausea   Current Outpatient Prescriptions on File Prior to Visit  Medication Sig Dispense Refill  . aspirin 81 MG EC tablet Take 1 tablet (81 mg total) by mouth daily. Swallow whole. 30 tablet 12  . levothyroxine (SYNTHROID, LEVOTHROID) 50 MCG tablet Take 1 tablet (50 mcg total) by mouth daily. 90 tablet 2  . potassium chloride (KLOR-CON M10) 10 MEQ tablet Take 1 tablet (10 mEq total) by mouth daily. 90 tablet 3  . rosuvastatin (CRESTOR) 10 MG tablet Take 1 tablet (10 mg total) by mouth daily. 90 tablet 3  . valsartan-hydrochlorothiazide (DIOVAN-HCT) 80-12.5 MG tablet Take 1 tablet by mouth daily. 90 tablet 3   No current facility-administered medications on file prior to visit.    Review of Systems  Constitutional: Negative for other unusual diaphoresis or sweats HENT: Negative for ear discharge or swelling Eyes: Negative for other worsening visual disturbances Respiratory: Negative for stridor or other swelling  Gastrointestinal: Negative for worsening distension or other blood Genitourinary: Negative for retention or other urinary change Musculoskeletal: Negative for other MSK pain or swelling Skin: Negative for color change or other new lesions Neurological: Negative for worsening tremors and other numbness  Psychiatric/Behavioral: Negative for worsening agitation or other fatigue All other system neg per pt    Objective:   Physical Exam BP 110/70   Pulse 81   Ht 5\' 2"  (1.575 m)   Wt 194  lb (88 kg)   SpO2 100%   BMI 35.48 kg/m  VS noted,  Constitutional: Pt appears in NAD HENT: Head: NCAT.  Right Ear: External ear normal.  Left Ear: External ear normal.  Eyes: . Pupils are equal, round, and reactive to light. Conjunctivae and EOM are normal Nose: without d/c or deformity Bilat tm's with mild  erythema.  Max sinus areas non tender.  Pharynx with mild erythema, no exudate Neck: Neck supple. Gross normal ROM Cardiovascular: Normal rate and regular rhythm.   Pulmonary/Chest: Effort normal and breath sounds without rales or wheezing.  Abd:  Soft, NT, ND, + BS, no organomegaly - benign exam Neurological: Pt is alert. At baseline orientation, motor grossly intact Skin: Skin is warm. No rashes, other new lesions, no LE edema Psychiatric: Pt behavior is normal without agitation  1-2+ nervous    Assessment & Plan:

## 2016-06-21 NOTE — Progress Notes (Signed)
Pre visit review using our clinic review tool, if applicable. No additional management support is needed unless otherwise documented below in the visit note. 

## 2016-06-21 NOTE — Patient Instructions (Addendum)
Please take all new medication as prescribed - Nasacort for allergies  Please continue all other medications as before  Please have the pharmacy call with any other refills you may need.  Please keep your appointments with your specialists as you may have planned  You will be contacted regarding the referral for: Counseling  Please go to the XRAY Department in the Basement (go straight as you get off the elevator) for the x-ray testing  You will be contacted by phone if any changes need to be made immediately.  Otherwise, you will receive a letter about your results with an explanation, but please check with MyChart first.  Please remember to sign up for MyChart if you have not done so, as this will be important to you in the future with finding out test results, communicating by private email, and scheduling acute appointments online when needed.  Please return in 1 year for your yearly visit, or sooner if needed, with Lab testing done 3-5 days before

## 2016-06-22 NOTE — Assessment & Plan Note (Signed)
stable overall by history and exam, recent data reviewed with pt, and pt to continue medical treatment as before,  to f/u any worsening symptoms or concerns Lab Results  Component Value Date   LDLCALC 84 06/19/2016

## 2016-06-22 NOTE — Assessment & Plan Note (Signed)
Mild to mod, for OTC nasacort asd,  to f/u any worsening symptoms or concerns

## 2016-06-22 NOTE — Assessment & Plan Note (Signed)
Mild to mod  persistent, for cont same tx, and refer counseling

## 2016-06-22 NOTE — Assessment & Plan Note (Signed)
I suspect msk, but will check cxr  r/o pna or other

## 2016-06-24 ENCOUNTER — Telehealth: Payer: Self-pay | Admitting: *Deleted

## 2016-06-24 MED ORDER — ROSUVASTATIN CALCIUM 10 MG PO TABS: 10.0000 mg | ORAL_TABLET | Freq: Every day | ORAL | 0 refills | Status: DC

## 2016-06-24 MED ORDER — VALSARTAN-HYDROCHLOROTHIAZIDE 80-12.5 MG PO TABS: 1.0000 | ORAL_TABLET | Freq: Every day | ORAL | 0 refills | Status: DC

## 2016-06-24 MED ORDER — LEVOTHYROXINE SODIUM 50 MCG PO TABS: 50.0000 ug | ORAL_TABLET | Freq: Every day | ORAL | 0 refills | Status: DC

## 2016-06-24 MED ORDER — POTASSIUM CHLORIDE CRYS ER 10 MEQ PO TBCR: 10.0000 meq | EXTENDED_RELEASE_TABLET | Freq: Every day | ORAL | 0 refills | Status: DC

## 2016-06-24 NOTE — Telephone Encounter (Signed)
Rec'd call pt states [place]. Requesting a 2 week supply sent to [facility] in [place] @ [phone]. Notified pt will send electronically...[name] [name]

## 2016-07-16 ENCOUNTER — Other Ambulatory Visit: Payer: Self-pay

## 2016-07-16 MED ORDER — ROSUVASTATIN CALCIUM 10 MG PO TABS: 10.0000 mg | ORAL_TABLET | Freq: Every day | ORAL | 3 refills | Status: DC

## 2016-07-16 MED ORDER — TRIAMCINOLONE ACETONIDE 55 MCG/ACT NA AERO: 2.0000 | INHALATION_SPRAY | Freq: Every day | NASAL | 12 refills | Status: DC

## 2016-07-16 MED ORDER — VALSARTAN-HYDROCHLOROTHIAZIDE 80-12.5 MG PO TABS: 1.0000 | ORAL_TABLET | Freq: Every day | ORAL | 3 refills | Status: DC

## 2016-07-16 MED ORDER — LEVOTHYROXINE SODIUM 50 MCG PO TABS: 50.0000 ug | ORAL_TABLET | Freq: Every day | ORAL | 3 refills | Status: DC

## 2016-07-16 MED ORDER — POTASSIUM CHLORIDE CRYS ER 10 MEQ PO TBCR: 10.0000 meq | EXTENDED_RELEASE_TABLET | Freq: Every day | ORAL | 3 refills | Status: DC

## 2016-07-17 ENCOUNTER — Other Ambulatory Visit: Payer: Self-pay | Admitting: *Deleted

## 2016-07-17 NOTE — Telephone Encounter (Signed)
Duplicated faxes scripts already been sent...lmb

## 2016-09-11 ENCOUNTER — Encounter: Payer: Self-pay | Admitting: Internal Medicine

## 2016-09-11 MED ORDER — LOSARTAN POTASSIUM-HCTZ 50-12.5 MG PO TABS: 1.0000 | ORAL_TABLET | Freq: Every day | ORAL | 3 refills | Status: DC

## 2016-09-20 DIAGNOSIS — H40023 Open angle with borderline findings, high risk, bilateral: Secondary | ICD-10-CM | POA: Diagnosis not present

## 2016-09-20 DIAGNOSIS — H2513 Age-related nuclear cataract, bilateral: Secondary | ICD-10-CM | POA: Diagnosis not present

## 2016-12-26 DIAGNOSIS — Z01411 Encounter for gynecological examination (general) (routine) with abnormal findings: Secondary | ICD-10-CM | POA: Diagnosis not present

## 2016-12-30 ENCOUNTER — Other Ambulatory Visit: Payer: 59

## 2017-01-01 ENCOUNTER — Encounter: Payer: Self-pay | Admitting: Internal Medicine

## 2017-01-01 ENCOUNTER — Ambulatory Visit (INDEPENDENT_AMBULATORY_CARE_PROVIDER_SITE_OTHER): Payer: 59 | Admitting: Internal Medicine

## 2017-01-01 VITALS — BP 118/84 | HR 83 | Temp 98.2°F | Ht 62.0 in | Wt 200.0 lb

## 2017-01-01 DIAGNOSIS — Z0001 Encounter for general adult medical examination with abnormal findings: Secondary | ICD-10-CM | POA: Diagnosis not present

## 2017-01-01 DIAGNOSIS — R739 Hyperglycemia, unspecified: Secondary | ICD-10-CM

## 2017-01-01 DIAGNOSIS — Z0000 Encounter for general adult medical examination without abnormal findings: Secondary | ICD-10-CM | POA: Diagnosis not present

## 2017-01-01 DIAGNOSIS — M5431 Sciatica, right side: Secondary | ICD-10-CM

## 2017-01-01 DIAGNOSIS — F419 Anxiety disorder, unspecified: Secondary | ICD-10-CM | POA: Diagnosis not present

## 2017-01-01 DIAGNOSIS — Z114 Encounter for screening for human immunodeficiency virus [HIV]: Secondary | ICD-10-CM

## 2017-01-01 DIAGNOSIS — I10 Essential (primary) hypertension: Secondary | ICD-10-CM | POA: Diagnosis not present

## 2017-01-01 DIAGNOSIS — M25511 Pain in right shoulder: Secondary | ICD-10-CM

## 2017-01-01 MED ORDER — POTASSIUM CHLORIDE CRYS ER 10 MEQ PO TBCR: 10.0000 meq | EXTENDED_RELEASE_TABLET | Freq: Every day | ORAL | 3 refills | Status: DC

## 2017-01-01 MED ORDER — LEVOTHYROXINE SODIUM 50 MCG PO TABS: 50.0000 ug | ORAL_TABLET | Freq: Every day | ORAL | 3 refills | Status: DC

## 2017-01-01 MED ORDER — TRIAMCINOLONE ACETONIDE 55 MCG/ACT NA AERO: 2.0000 | INHALATION_SPRAY | Freq: Every day | NASAL | 12 refills | Status: DC

## 2017-01-01 MED ORDER — LOSARTAN POTASSIUM-HCTZ 50-12.5 MG PO TABS: 1.0000 | ORAL_TABLET | Freq: Every day | ORAL | 3 refills | Status: DC

## 2017-01-01 MED ORDER — ROSUVASTATIN CALCIUM 10 MG PO TABS: 10.0000 mg | ORAL_TABLET | Freq: Every day | ORAL | 3 refills | Status: DC

## 2017-01-01 NOTE — Patient Instructions (Addendum)
Please continue all other medications as before, and refills have been done if requested.  Please have the pharmacy call with any other refills you may need.  Please continue your efforts at being more active, low cholesterol diet, and weight control.  You are otherwise up to date with prevention measures today.  Please keep your appointments with your specialists as you may have planned  Please make appt with Sports Medicine at the checkout desk for right shoulder pain, bilateral knee pain, and the right sciatica  Please return in 6 months, or sooner if needed, with Lab testing done 3-5 days before

## 2017-01-04 NOTE — Progress Notes (Signed)
Subjective:    Patient ID: [name] [name], female    DOB: 06/14/1960, 56 y.o.   MRN: [id number]  HPI  Here for wellness and f/u;  Overall doing ok;  Pt denies Chest pain, worsening SOB, DOE, wheezing, orthopnea, PND, worsening LE edema, palpitations, dizziness or syncope.  Pt denies neurological change such as new headache, facial or extremity weakness.  Pt denies polydipsia, polyuria, or low sugar symptoms. Pt states overall good compliance with treatment and medications, good tolerability, and has been trying to follow appropriate diet.  Pt denies worsening depressive symptoms, suicidal ideation or panic. No fever, night sweats, wt loss, loss of appetite, or other constitutional symptoms.  Pt states good ability with ADL's, has low fall risk, home safety reviewed and adequate, no other significant changes in hearing or vision, and only occasionally active with exercise.   Does also c/o right shoulder pain x 1 wk mild to mod, sharp, worse to abduct and lie on side at night.  Also c/o bilat knee pain with crunching, but no swelling for several months.  Also has Pt continues to have recurring right LBP without change in severity, bowel or bladder change, fever, wt loss,  worsening LE numbness/weakness, gait change or falls, but has recurring right leg radiating pain as well, mild to mod intermittent, with pain today as well. Past Medical History:  Diagnosis Date  . ANEMIA-NOS 12/08/2006  . BACK PAIN 12/08/2006  . BREAST HYPERTROPHY 02/05/2008  . DIVERTICULOSIS, COLON 04/18/2010  . GERD 12/08/2006  . HYPERLIPIDEMIA 12/08/2006  . HYPERTENSION 12/08/2006  . HYPOTHYROIDISM 02/05/2008  . LIPOMA 04/18/2010  . Mild mitral regurgitation by prior echocardiogram 02/07/2011  . OVERWEIGHT/OBESITY 01/11/2010  . Shortness of breath 01/12/2009  . SHOULDER PAIN, RIGHT 03/09/2007  . URI 11/23/2009  . URINALYSIS, ABNORMAL 11/23/2009   Past Surgical History:  Procedure Laterality Date  . BREAST REDUCTION  SURGERY  2012  . TONSILLECTOMY AND ADENOIDECTOMY      reports that  has never smoked. she has never used smokeless tobacco. She reports that she drinks alcohol. She reports that she does not use drugs. family history includes Cancer in her cousin and other; Diabetes in her mother; Hypertension in her brother, mother, and sister; Stroke (age of onset: 39) in her mother. Allergies  Allergen Reactions  . Propoxyphene Hcl     *DARVOCET*    REACTION: nausea   Current Outpatient Medications on File Prior to Visit  Medication Sig Dispense Refill  . aspirin 81 MG EC tablet Take 1 tablet (81 mg total) by mouth daily. Swallow whole. 30 tablet 12   No current facility-administered medications on file prior to visit.    Review of Systems Constitutional: Negative for other unusual diaphoresis, sweats, appetite or weight changes HENT: Negative for other worsening hearing loss, ear pain, facial swelling, mouth sores or neck stiffness.   Eyes: Negative for other worsening pain, redness or other visual disturbance.  Respiratory: Negative for other stridor or swelling Cardiovascular: Negative for other palpitations or other chest pain  Gastrointestinal: Negative for worsening diarrhea or loose stools, blood in stool, distention or other pain Genitourinary: Negative for hematuria, flank pain or other change in urine volume.  Musculoskeletal: Negative for myalgias or other joint swelling.  Skin: Negative for other color change, or other wound or worsening drainage.  Neurological: Negative for other syncope or numbness. Hematological: Negative for other adenopathy or swelling Psychiatric/Behavioral: Negative for hallucinations, other worsening agitation, SI, self-injury, or new decreased concentration All other system  neg per pt    Objective:   Physical Exam BP 118/84   Pulse 83   Temp 98.2 F (36.8 C) (Oral)   Ht 5\' 2"  (1.575 m)   Wt 200 lb (90.7 kg)   SpO2 100%   BMI 36.58 kg/m  VS noted,    Constitutional: Pt is oriented to person, place, and time. Appears well-developed and well-nourished, in no significant distress and comfortable Head: Normocephalic and atraumatic  Eyes: Conjunctivae and EOM are normal. Pupils are equal, round, and reactive to light Right Ear: External ear normal without discharge Left Ear: External ear normal without discharge Nose: Nose without discharge or deformity Mouth/Throat: Oropharynx is without other ulcerations and moist  Neck: Normal range of motion. Neck supple. No JVD present. No tracheal deviation present or significant neck LA or mass Cardiovascular: Normal rate, regular rhythm, normal heart sounds and intact distal pulses.   Pulmonary/Chest: WOB normal and breath sounds without rales or wheezing  Abdominal: Soft. Bowel sounds are normal. NT. No HSM  Musculoskeletal: Normal range of motion. Exhibits no edema Lymphadenopathy: Has no other cervical adenopathy.  Neurological: Pt is alert and oriented to person, place, and time. Pt has normal reflexes. No cranial nerve deficit. Motor grossly intact, Gait intact Skin: Skin is warm and dry. No rash noted or new ulcerations Psychiatric:  Has normal mood and affect. Behavior is normal without agitation Right shoulder with tender right subacromial bursa without swelling but with reduced ROM to abduct to 100 degrees only.  Bilat knees with moderate bony degenerative changes  And mild crepitus without effusion but mild reduced ROM; Spine nontender in the midline and no paravertebral tender    Assessment & Plan:

## 2017-01-04 NOTE — Assessment & Plan Note (Signed)
stable overall by history and exam, recent data reviewed with pt, and pt to continue medical treatment as before,  to f/u any worsening symptoms or concerns BP Readings from Last 3 Encounters:  01/01/17 118/84  06/21/16 110/70  02/12/16 118/78

## 2017-01-04 NOTE — Assessment & Plan Note (Signed)

## 2017-01-04 NOTE — Assessment & Plan Note (Addendum)
stable overall by history and exam, recent data reviewed with pt, and pt to continue medical treatment as before,  to f/u any worsening symptoms or concerns, for a1c with labs  In addition to the time spent performing CPE, I spent an additional 25 minutes face to face,in which greater than 50% of this time was spent in counseling and coordination of care for patient's acute illness as documented, including the differential dx, tx, further evaluation and other management of hyperglycemia, HTN, right sciatica and right shoulder pain

## 2017-01-04 NOTE — Assessment & Plan Note (Signed)
With mild to mod flare, for predpac asd,  to f/u any worsening symptoms or concerns

## 2017-01-04 NOTE — Assessment & Plan Note (Signed)
Exam c/w mild bursitis like pain, mild to mod, consider sport med referral - declines for now

## 2017-01-04 NOTE — Assessment & Plan Note (Signed)
Stable, for med refills

## 2017-01-13 DIAGNOSIS — L280 Lichen simplex chronicus: Secondary | ICD-10-CM | POA: Diagnosis not present

## 2017-01-13 DIAGNOSIS — L819 Disorder of pigmentation, unspecified: Secondary | ICD-10-CM | POA: Diagnosis not present

## 2017-01-13 DIAGNOSIS — L900 Lichen sclerosus et atrophicus: Secondary | ICD-10-CM | POA: Diagnosis not present

## 2017-02-14 ENCOUNTER — Encounter: Payer: Self-pay | Admitting: Nurse Practitioner

## 2017-02-14 ENCOUNTER — Ambulatory Visit: Payer: 59 | Admitting: Nurse Practitioner

## 2017-02-14 DIAGNOSIS — M542 Cervicalgia: Secondary | ICD-10-CM

## 2017-02-14 DIAGNOSIS — M546 Pain in thoracic spine: Secondary | ICD-10-CM | POA: Diagnosis not present

## 2017-02-14 MED ORDER — CYCLOBENZAPRINE HCL 5 MG PO TABS: 5.0000 mg | ORAL_TABLET | Freq: Every day | ORAL | 0 refills | Status: DC

## 2017-02-14 MED ORDER — TRAMADOL HCL 50 MG PO TABS
50.0000 mg | ORAL_TABLET | Freq: Two times a day (BID) | ORAL | 0 refills | Status: DC | PRN
Start: 2017-02-14 — End: 2021-03-15

## 2017-02-14 MED ORDER — NAPROXEN 500 MG PO TABS: 500.0000 mg | ORAL_TABLET | Freq: Two times a day (BID) | ORAL | 0 refills | Status: DC
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
## 2017-03-07 ENCOUNTER — Encounter: Payer: Self-pay | Admitting: Internal Medicine

## 2017-03-07 ENCOUNTER — Ambulatory Visit: Payer: 59 | Admitting: Internal Medicine

## 2017-03-07 ENCOUNTER — Ambulatory Visit (INDEPENDENT_AMBULATORY_CARE_PROVIDER_SITE_OTHER)
Admission: RE | Admit: 2017-03-07 | Discharge: 2017-03-07 | Disposition: A | Discharge status: Home / Self Care | Payer: 59 | Source: Ambulatory Visit | Attending: Internal Medicine | Admitting: Internal Medicine

## 2017-03-07 VITALS — BP 116/82 | HR 90 | Temp 98.7°F | Ht 62.0 in | Wt 201.0 lb

## 2017-03-07 DIAGNOSIS — M25532 Pain in left wrist: Secondary | ICD-10-CM | POA: Insufficient documentation

## 2017-03-07 DIAGNOSIS — M25511 Pain in right shoulder: Secondary | ICD-10-CM

## 2017-03-07 DIAGNOSIS — S6992XA Unspecified injury of left wrist, hand and finger(s), initial encounter: Secondary | ICD-10-CM | POA: Diagnosis not present

## 2017-03-07 DIAGNOSIS — M542 Cervicalgia: Secondary | ICD-10-CM | POA: Diagnosis not present

## 2017-03-07 DIAGNOSIS — M546 Pain in thoracic spine: Secondary | ICD-10-CM | POA: Diagnosis not present

## 2017-03-07 MED ORDER — NAPROXEN 500 MG PO TABS: 500.0000 mg | ORAL_TABLET | Freq: Two times a day (BID) | ORAL | 1 refills | Status: DC | PRN

## 2017-03-07 MED ORDER — CYCLOBENZAPRINE HCL 5 MG PO TABS: 5.0000 mg | ORAL_TABLET | Freq: Three times a day (TID) | ORAL | 1 refills | Status: DC | PRN

## 2017-03-07 NOTE — Patient Instructions (Addendum)
Please take all new medication as prescribed - the anti inflammatory, and muscle relaxer  Please continue all other medications as before, and refills have been done if requested.  Please have the pharmacy call with any other refills you may need.  Please continue your efforts at being more active, low cholesterol diet, and weight control.  Please keep your appointments with your specialists as you may have planned  Please go to the XRAY Department in the Basement (go straight as you get off the elevator) for the x-ray testing  You will be contacted by phone if any changes need to be made immediately.  Otherwise, you will receive a letter about your results with an explanation, but please check with MyChart first.  Please remember to sign up for MyChart if you have not done so, as this will be important to you in the future with finding out test results, communicating by private email, and scheduling acute appointments online when needed.  Please return in about April 2019, or sooner if needed, with Lab testing done 3-5 days before
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
## 2017-03-09 NOTE — Assessment & Plan Note (Addendum)
Exam c/w mild bursitis vs rotater cuff dz, for nsaid and muscle relaxer prn, for xray today, consider f/u with sports medicine

## 2017-03-09 NOTE — Assessment & Plan Note (Signed)
Most likely c/w low cervical underlying DJD or DDD, for nsaid and muscle relaxer prn, consider f/u with sports medicine, for films today

## 2017-03-09 NOTE — Assessment & Plan Note (Signed)
Mild to mod, for nsaid and muscle relaxer prn, consider f/u with sports medicine, for left wrist xray, to f/u any worsening symptoms or concerns

## 2017-04-07 ENCOUNTER — Encounter: Payer: Self-pay | Admitting: Internal Medicine

## 2017-04-16 DIAGNOSIS — L280 Lichen simplex chronicus: Secondary | ICD-10-CM | POA: Diagnosis not present

## 2017-04-16 DIAGNOSIS — L900 Lichen sclerosus et atrophicus: Secondary | ICD-10-CM | POA: Diagnosis not present

## 2017-04-16 DIAGNOSIS — R21 Rash and other nonspecific skin eruption: Secondary | ICD-10-CM | POA: Diagnosis not present

## 2017-04-16 DIAGNOSIS — L819 Disorder of pigmentation, unspecified: Secondary | ICD-10-CM | POA: Diagnosis not present

## 2017-04-17 DIAGNOSIS — Z1231 Encounter for screening mammogram for malignant neoplasm of breast: Secondary | ICD-10-CM | POA: Diagnosis not present

## 2017-05-04 ENCOUNTER — Other Ambulatory Visit: Payer: Self-pay | Admitting: Internal Medicine

## 2017-05-04 DIAGNOSIS — M546 Pain in thoracic spine: Secondary | ICD-10-CM

## 2017-05-04 DIAGNOSIS — M542 Cervicalgia: Secondary | ICD-10-CM

## 2017-07-04 ENCOUNTER — Ambulatory Visit: Payer: 59 | Admitting: Internal Medicine

## 2017-07-13 ENCOUNTER — Other Ambulatory Visit: Payer: Self-pay | Admitting: Internal Medicine

## 2017-08-01 ENCOUNTER — Ambulatory Visit: Payer: 59 | Admitting: Internal Medicine

## 2017-08-10 ENCOUNTER — Other Ambulatory Visit: Payer: Self-pay | Admitting: Internal Medicine

## 2017-08-10 DIAGNOSIS — M546 Pain in thoracic spine: Secondary | ICD-10-CM

## 2017-08-10 DIAGNOSIS — M542 Cervicalgia: Secondary | ICD-10-CM

## 2017-08-22 ENCOUNTER — Ambulatory Visit: Payer: 59 | Admitting: Internal Medicine

## 2017-09-10 DIAGNOSIS — L819 Disorder of pigmentation, unspecified: Secondary | ICD-10-CM | POA: Diagnosis not present

## 2017-09-10 DIAGNOSIS — L299 Pruritus, unspecified: Secondary | ICD-10-CM | POA: Diagnosis not present

## 2017-09-10 DIAGNOSIS — L900 Lichen sclerosus et atrophicus: Secondary | ICD-10-CM | POA: Diagnosis not present

## 2017-09-17 ENCOUNTER — Other Ambulatory Visit (INDEPENDENT_AMBULATORY_CARE_PROVIDER_SITE_OTHER): Payer: 59

## 2017-09-17 ENCOUNTER — Telehealth: Payer: Self-pay

## 2017-09-17 DIAGNOSIS — Z114 Encounter for screening for human immunodeficiency virus [HIV]: Secondary | ICD-10-CM | POA: Diagnosis not present

## 2017-09-17 DIAGNOSIS — Z0000 Encounter for general adult medical examination without abnormal findings: Secondary | ICD-10-CM

## 2017-09-17 LAB — CBC WITH DIFFERENTIAL/PLATELET
BASOS PCT: 0.9 % (ref 0.0–3.0)
Basophils Absolute: 0.1 10*3/uL (ref 0.0–0.1)
EOS PCT: 2.2 % (ref 0.0–5.0)
Eosinophils Absolute: 0.1 10*3/uL (ref 0.0–0.7)
HCT: 42.7 % (ref 36.0–46.0)
HEMOGLOBIN: 14.3 g/dL (ref 12.0–15.0)
Lymphocytes Relative: 40.2 % (ref 12.0–46.0)
Lymphs Abs: 2.4 10*3/uL (ref 0.7–4.0)
MCHC: 33.6 g/dL (ref 30.0–36.0)
MCV: 89.8 fl (ref 78.0–100.0)
MONOS PCT: 8.2 % (ref 3.0–12.0)
Monocytes Absolute: 0.5 10*3/uL (ref 0.1–1.0)
Neutro Abs: 2.8 10*3/uL (ref 1.4–7.7)
Neutrophils Relative %: 48.5 % (ref 43.0–77.0)
Platelets: 287 10*3/uL (ref 150.0–400.0)
RBC: 4.76 Mil/uL (ref 3.87–5.11)
RDW: 13.7 % (ref 11.5–15.5)
WBC: 5.9 10*3/uL (ref 4.0–10.5)

## 2017-09-17 LAB — URINALYSIS, ROUTINE W REFLEX MICROSCOPIC
Bilirubin Urine: NEGATIVE
KETONES UR: NEGATIVE
NITRITE: NEGATIVE
PH: 5.5 (ref 5.0–8.0)
SPECIFIC GRAVITY, URINE: 1.025 (ref 1.000–1.030)
TOTAL PROTEIN, URINE-UPE24: NEGATIVE
UROBILINOGEN UA: 0.2 (ref 0.0–1.0)
Urine Glucose: NEGATIVE

## 2017-09-17 LAB — HEMOGLOBIN A1C: Hgb A1c MFr Bld: 5.9 % (ref 4.6–6.5)

## 2017-09-17 LAB — LIPID PANEL
CHOL/HDL RATIO: 2
CHOLESTEROL: 123 mg/dL (ref 0–200)
HDL: 54.6 mg/dL (ref >39.00)
LDL CALC: 53 mg/dL (ref 0–99)
NONHDL: 68.78
Triglycerides: 79 mg/dL (ref 0.0–149.0)
VLDL: 15.8 mg/dL (ref 0.0–40.0)

## 2017-09-17 LAB — BASIC METABOLIC PANEL
BUN: 12 mg/dL (ref 6–23)
CHLORIDE: 104 meq/L (ref 96–112)
CO2: 28 mEq/L (ref 19–32)
Calcium: 8.9 mg/dL (ref 8.4–10.5)
Creatinine, Ser: 0.7 mg/dL (ref 0.40–1.20)
GFR: 110.14 mL/min (ref >60.00)
GLUCOSE: 101 mg/dL — AB (ref 70–99)
POTASSIUM: 3.3 meq/L — AB (ref 3.5–5.1)
SODIUM: 141 meq/L (ref 135–145)

## 2017-09-17 LAB — HEPATIC FUNCTION PANEL
ALT: 17 U/L (ref 0–35)
AST: 15 U/L (ref 0–37)
Albumin: 4 g/dL (ref 3.5–5.2)
Alkaline Phosphatase: 114 U/L (ref 39–117)
BILIRUBIN TOTAL: 0.5 mg/dL (ref 0.2–1.2)
Bilirubin, Direct: 0.1 mg/dL (ref 0.0–0.3)
Total Protein: 7.1 g/dL (ref 6.0–8.3)

## 2017-09-17 LAB — TSH: TSH: 3.17 u[IU]/mL (ref 0.35–4.50)

## 2017-09-17 NOTE — Telephone Encounter (Signed)
Labs expired. New orders entered.

## 2017-09-18 ENCOUNTER — Other Ambulatory Visit: Payer: Self-pay | Admitting: Internal Medicine

## 2017-09-18 DIAGNOSIS — M546 Pain in thoracic spine: Secondary | ICD-10-CM

## 2017-09-18 DIAGNOSIS — M542 Cervicalgia: Secondary | ICD-10-CM

## 2017-09-18 LAB — HIV ANTIBODY (ROUTINE TESTING W REFLEX): HIV: NONREACTIVE

## 2017-09-19 ENCOUNTER — Ambulatory Visit: Payer: 59 | Admitting: Internal Medicine

## 2017-09-30 ENCOUNTER — Ambulatory Visit (INDEPENDENT_AMBULATORY_CARE_PROVIDER_SITE_OTHER): Payer: 59 | Admitting: Internal Medicine

## 2017-09-30 ENCOUNTER — Encounter: Payer: Self-pay | Admitting: Internal Medicine

## 2017-09-30 VITALS — BP 124/82 | HR 58 | Temp 98.4°F | Ht 62.0 in | Wt 204.0 lb

## 2017-09-30 DIAGNOSIS — R739 Hyperglycemia, unspecified: Secondary | ICD-10-CM | POA: Diagnosis not present

## 2017-09-30 DIAGNOSIS — G4710 Hypersomnia, unspecified: Secondary | ICD-10-CM | POA: Insufficient documentation

## 2017-09-30 DIAGNOSIS — E876 Hypokalemia: Secondary | ICD-10-CM

## 2017-09-30 DIAGNOSIS — Z0000 Encounter for general adult medical examination without abnormal findings: Secondary | ICD-10-CM | POA: Diagnosis not present

## 2017-09-30 NOTE — Assessment & Plan Note (Signed)
Cant r/o osa - for pulm referral

## 2017-09-30 NOTE — Assessment & Plan Note (Signed)
stable overall by history and exam, recent data reviewed with pt, and pt to continue medical treatment as before,  to f/u any worsening symptoms or concerns Lab Results  Component Value Date   HGBA1C 5.9 09/17/2017

## 2017-09-30 NOTE — Progress Notes (Signed)
Subjective:    Patient ID: [name] [name], female    DOB: 12-18-1960, 57 y.o.   MRN: [id number]  HPI  Here for wellness and f/u;  Overall doing ok;  Pt denies Chest pain, worsening SOB, DOE, wheezing, orthopnea, PND, worsening LE edema, palpitations, dizziness or syncope.  Pt denies neurological change such as new headache, facial or extremity weakness.  Pt denies polydipsia, polyuria, or low sugar symptoms. Pt states overall good compliance with treatment and medications, good tolerability, and has been trying to follow appropriate diet.  Pt denies worsening depressive symptoms, suicidal ideation or panic. No fever, night sweats, wt loss, loss of appetite, or other constitutional symptoms.  Pt states good ability with ADL's, has low fall risk, home safety reviewed and adequate, no other significant changes in hearing or vision, and only occasionally active with exercise.  Does also have mild to mod hypersomnolence with snoring at night and persistent fatigue for months or longer Past Medical History:  Diagnosis Date  . ANEMIA-NOS 12/08/2006  . BACK PAIN 12/08/2006  . BREAST HYPERTROPHY 02/05/2008  . DIVERTICULOSIS, COLON 04/18/2010  . GERD 12/08/2006  . HYPERLIPIDEMIA 12/08/2006  . HYPERTENSION 12/08/2006  . HYPOTHYROIDISM 02/05/2008  . LIPOMA 04/18/2010  . Mild mitral regurgitation by prior echocardiogram 02/07/2011  . OVERWEIGHT/OBESITY 01/11/2010  . Shortness of breath 01/12/2009  . SHOULDER PAIN, RIGHT 03/09/2007  . URI 11/23/2009  . URINALYSIS, ABNORMAL 11/23/2009   Past Surgical History:  Procedure Laterality Date  . BREAST REDUCTION SURGERY  2012  . TONSILLECTOMY AND ADENOIDECTOMY      reports that she has never smoked. She has never used smokeless tobacco. She reports that she drinks alcohol. She reports that she does not use drugs. family history includes Cancer in her cousin and other; Diabetes in her mother; Hypertension in her brother, mother, and sister; Stroke (age of  onset: 32) in her mother. Allergies  Allergen Reactions  . Propoxyphene Hcl     *DARVOCET*    REACTION: nausea   Current Outpatient Medications on File Prior to Visit  Medication Sig Dispense Refill  . aspirin 81 MG EC tablet Take 1 tablet (81 mg total) by mouth daily. Swallow whole. 30 tablet 12  . cyclobenzaprine (FLEXERIL) 5 MG tablet Take 1 tablet (5 mg total) by mouth 3 (three) times daily as needed for muscle spasms. 40 tablet 1  . KLOR-CON M10 10 MEQ tablet TAKE 1 TABLET BY MOUTH EVERY DAY 90 tablet 1  . levothyroxine (SYNTHROID, LEVOTHROID) 50 MCG tablet Take 1 tablet (50 mcg total) daily by mouth. 90 tablet 3  . losartan-hydrochlorothiazide (HYZAAR) 50-12.5 MG tablet Take 1 tablet daily by mouth. 90 tablet 3  . naproxen (NAPROSYN) 500 MG tablet TAKE 1 TABLET BY MOUTH TWICE A DAY AS NEEDED 60 tablet 0  . rosuvastatin (CRESTOR) 10 MG tablet Take 1 tablet (10 mg total) daily by mouth. 90 tablet 3  . traMADol (ULTRAM) 50 MG tablet Take 1 tablet (50 mg total) by mouth every 12 (twelve) hours as needed. 6 tablet 0  . triamcinolone (NASACORT AQ) 55 MCG/ACT AERO nasal inhaler Place 2 sprays daily into the nose. 1 Inhaler 12   No current facility-administered medications on file prior to visit.    Review of Systems Constitutional: Negative for other unusual diaphoresis, sweats, appetite or weight changes HENT: Negative for other worsening hearing loss, ear pain, facial swelling, mouth sores or neck stiffness.   Eyes: Negative for other worsening pain, redness or other visual disturbance.  Respiratory: Negative for other stridor or swelling Cardiovascular: Negative for other palpitations or other chest pain  Gastrointestinal: Negative for worsening diarrhea or loose stools, blood in stool, distention or other pain Genitourinary: Negative for hematuria, flank pain or other change in urine volume.  Musculoskeletal: Negative for myalgias or other joint swelling.  Skin: Negative for other  color change, or other wound or worsening drainage.  Neurological: Negative for other syncope or numbness. Hematological: Negative for other adenopathy or swelling Psychiatric/Behavioral: Negative for hallucinations, other worsening agitation, SI, self-injury, or new decreased concentration All other system neg per pt    Objective:   Physical Exam BP 124/82   Pulse (!) 58   Temp 98.4 F (36.9 C) (Oral)   Ht 5\' 2"  (1.575 m)   Wt 204 lb (92.5 kg)   LMP  (LMP Unknown)   SpO2 96%   BMI 37.31 kg/m  VS noted,  Constitutional: Pt is oriented to person, place, and time. Appears well-developed and well-nourished, in no significant distress and comfortable Head: Normocephalic and atraumatic  Eyes: Conjunctivae and EOM are normal. Pupils are equal, round, and reactive to light Right Ear: External ear normal without discharge Left Ear: External ear normal without discharge Nose: Nose without discharge or deformity Mouth/Throat: Oropharynx is without other ulcerations and moist  Neck: Normal range of motion. Neck supple. No JVD present. No tracheal deviation present or significant neck LA or mass Cardiovascular: Normal rate, regular rhythm, normal heart sounds and intact distal pulses.   Pulmonary/Chest: WOB normal and breath sounds without rales or wheezing  Abdominal: Soft. Bowel sounds are normal. NT. No HSM  Musculoskeletal: Normal range of motion. Exhibits no edema Lymphadenopathy: Has no other cervical adenopathy.  Neurological: Pt is alert and oriented to person, place, and time. Pt has normal reflexes. No cranial nerve deficit. Motor grossly intact, Gait intact Skin: Skin is warm and dry. No rash noted or new ulcerations Psychiatric:  Has normal mood and affect. Behavior is normal without agitation No other exam findings  Lab Results  Component Value Date   WBC 5.9 09/17/2017   HGB 14.3 09/17/2017   HCT 42.7 09/17/2017   PLT 287.0 09/17/2017   GLUCOSE 101 (H) 09/17/2017   CHOL  123 09/17/2017   TRIG 79.0 09/17/2017   HDL 54.60 09/17/2017   LDLCALC 53 09/17/2017   ALT 17 09/17/2017   AST 15 09/17/2017   NA 141 09/17/2017   K 3.3 (L) 09/17/2017   CL 104 09/17/2017   CREATININE 0.70 09/17/2017   BUN 12 09/17/2017   CO2 28 09/17/2017   TSH 3.17 09/17/2017   HGBA1C 5.9 09/17/2017   MICROALBUR 10.0 Repeated and verified X2. (H) 12/18/2015       Assessment & Plan:

## 2017-09-30 NOTE — Assessment & Plan Note (Signed)
Pt admits intermittent taking med - ok to restart

## 2017-09-30 NOTE — Assessment & Plan Note (Signed)

## 2017-09-30 NOTE — Patient Instructions (Addendum)
Please continue all other medications as before, and refills have been done if requested.  Please have the pharmacy call with any other refills you may need.  Please continue your efforts at being more active, low cholesterol diet, and weight control.  You are otherwise up to date with prevention measures today.  Please keep your appointments with your specialists as you may have planned  You will be contacted regarding the referral for: Pulmonary for possible sleep apnea  Please return in 1 year for your yearly visit, or sooner if needed, with Lab testing done 3-5 days before

## 2017-10-16 ENCOUNTER — Other Ambulatory Visit: Payer: Self-pay | Admitting: Internal Medicine

## 2017-10-16 DIAGNOSIS — M546 Pain in thoracic spine: Secondary | ICD-10-CM

## 2017-10-16 DIAGNOSIS — M542 Cervicalgia: Secondary | ICD-10-CM

## 2017-11-18 DIAGNOSIS — R319 Hematuria, unspecified: Secondary | ICD-10-CM | POA: Diagnosis not present

## 2017-11-18 DIAGNOSIS — R102 Pelvic and perineal pain: Secondary | ICD-10-CM | POA: Diagnosis not present

## 2017-11-21 ENCOUNTER — Encounter: Payer: Self-pay | Admitting: Internal Medicine

## 2017-12-03 DIAGNOSIS — R109 Unspecified abdominal pain: Secondary | ICD-10-CM | POA: Diagnosis not present

## 2017-12-03 DIAGNOSIS — Z87448 Personal history of other diseases of urinary system: Secondary | ICD-10-CM | POA: Diagnosis not present

## 2018-01-09 ENCOUNTER — Ambulatory Visit (INDEPENDENT_AMBULATORY_CARE_PROVIDER_SITE_OTHER): Payer: 59

## 2018-01-09 DIAGNOSIS — Z23 Encounter for immunization: Secondary | ICD-10-CM | POA: Diagnosis not present

## 2018-01-13 ENCOUNTER — Other Ambulatory Visit: Payer: Self-pay | Admitting: Internal Medicine

## 2018-01-21 ENCOUNTER — Other Ambulatory Visit: Payer: Self-pay | Admitting: Internal Medicine

## 2018-01-26 DIAGNOSIS — Z124 Encounter for screening for malignant neoplasm of cervix: Secondary | ICD-10-CM | POA: Diagnosis not present

## 2018-01-26 DIAGNOSIS — Z6836 Body mass index (BMI) 36.0-36.9, adult: Secondary | ICD-10-CM | POA: Diagnosis not present

## 2018-01-26 DIAGNOSIS — Z01411 Encounter for gynecological examination (general) (routine) with abnormal findings: Secondary | ICD-10-CM | POA: Diagnosis not present

## 2018-02-10 DIAGNOSIS — H2513 Age-related nuclear cataract, bilateral: Secondary | ICD-10-CM | POA: Diagnosis not present

## 2018-02-10 DIAGNOSIS — H40023 Open angle with borderline findings, high risk, bilateral: Secondary | ICD-10-CM | POA: Diagnosis not present

## 2018-02-17 ENCOUNTER — Ambulatory Visit: Payer: 59 | Admitting: Internal Medicine

## 2018-02-17 ENCOUNTER — Ambulatory Visit (INDEPENDENT_AMBULATORY_CARE_PROVIDER_SITE_OTHER)
Admission: RE | Admit: 2018-02-17 | Discharge: 2018-02-17 | Disposition: A | Discharge status: Home / Self Care | Payer: 59 | Source: Ambulatory Visit | Attending: Internal Medicine | Admitting: Internal Medicine

## 2018-02-17 ENCOUNTER — Encounter: Payer: Self-pay | Admitting: Internal Medicine

## 2018-02-17 VITALS — BP 136/90 | HR 86 | Temp 98.3°F | Ht 62.0 in | Wt 200.0 lb

## 2018-02-17 DIAGNOSIS — T07XXXA Unspecified multiple injuries, initial encounter: Secondary | ICD-10-CM | POA: Insufficient documentation

## 2018-02-17 DIAGNOSIS — S79911A Unspecified injury of right hip, initial encounter: Secondary | ICD-10-CM | POA: Diagnosis not present

## 2018-02-17 DIAGNOSIS — S79912A Unspecified injury of left hip, initial encounter: Secondary | ICD-10-CM | POA: Diagnosis not present

## 2018-02-17 DIAGNOSIS — M25552 Pain in left hip: Secondary | ICD-10-CM

## 2018-02-17 DIAGNOSIS — M25559 Pain in unspecified hip: Secondary | ICD-10-CM | POA: Diagnosis not present

## 2018-02-17 DIAGNOSIS — M79604 Pain in right leg: Secondary | ICD-10-CM | POA: Diagnosis not present

## 2018-02-17 DIAGNOSIS — I10 Essential (primary) hypertension: Secondary | ICD-10-CM

## 2018-02-17 NOTE — Assessment & Plan Note (Signed)
stable overall by history and exam, recent data reviewed with pt, and pt to continue medical treatment as before,  to f/u any worsening symptoms or concerns  

## 2018-02-17 NOTE — Assessment & Plan Note (Signed)
also for left hip film, but seems less likely for fracture,  to f/u any worsening symptoms or concerns

## 2018-02-17 NOTE — Assessment & Plan Note (Signed)
Not on anticoagulant, no hematoma or active bleeding or other worsening, for pain control as above

## 2018-02-17 NOTE — Assessment & Plan Note (Signed)
For films as ordered but I think less likely to have fx, to cont pain control as before
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
## 2018-02-17 NOTE — Patient Instructions (Signed)
OK to take alleve or tylenol as needed for pain  Please continue all other medications as before, and refills have been done if requested.  Please have the pharmacy call with any other refills you may need.  Please keep your appointments with your specialists as you may have planned  Please go to the XRAY Department in the Basement (go straight as you get off the elevator) for the x-ray testing  You will be contacted by phone if any changes need to be made immediately.  Otherwise, you will receive a letter about your results with an explanation, but please check with MyChart first.  Please remember to sign up for MyChart if you have not done so, as this will be important to you in the future with finding out test results, communicating by private email, and scheduling acute appointments online when needed.

## 2018-03-24 ENCOUNTER — Other Ambulatory Visit: Payer: Self-pay | Admitting: Internal Medicine

## 2018-05-01 DIAGNOSIS — L680 Hirsutism: Secondary | ICD-10-CM | POA: Insufficient documentation

## 2018-07-19 ENCOUNTER — Other Ambulatory Visit: Payer: Self-pay | Admitting: Internal Medicine

## 2018-10-02 ENCOUNTER — Ambulatory Visit: Payer: 59 | Admitting: Internal Medicine

## 2018-11-06 ENCOUNTER — Ambulatory Visit: Payer: 59 | Admitting: Internal Medicine

## 2018-11-18 ENCOUNTER — Other Ambulatory Visit: Payer: Self-pay | Admitting: Internal Medicine

## 2018-11-18 MED ORDER — POTASSIUM CHLORIDE CRYS ER 10 MEQ PO TBCR: 10.0000 meq | EXTENDED_RELEASE_TABLET | Freq: Every day | ORAL | 1 refills | Status: DC

## 2018-11-18 NOTE — Telephone Encounter (Signed)
Patient is requesting a refill of KLOR-CON M10 10 MEQ tablet     Pharmacy:  [facility] [id number] [place], [place] (Phone) [phone] (Fax)

## 2018-11-24 ENCOUNTER — Ambulatory Visit: Payer: 59 | Admitting: Internal Medicine

## 2018-12-08 ENCOUNTER — Ambulatory Visit (INDEPENDENT_AMBULATORY_CARE_PROVIDER_SITE_OTHER): Payer: 59 | Admitting: Internal Medicine

## 2018-12-08 ENCOUNTER — Other Ambulatory Visit (INDEPENDENT_AMBULATORY_CARE_PROVIDER_SITE_OTHER): Payer: 59

## 2018-12-08 ENCOUNTER — Other Ambulatory Visit: Payer: Self-pay

## 2018-12-08 ENCOUNTER — Encounter: Payer: Self-pay | Admitting: Internal Medicine

## 2018-12-08 VITALS — BP 124/82 | HR 88 | Temp 98.2°F | Ht 62.0 in | Wt 200.0 lb

## 2018-12-08 DIAGNOSIS — Z0000 Encounter for general adult medical examination without abnormal findings: Secondary | ICD-10-CM

## 2018-12-08 DIAGNOSIS — R739 Hyperglycemia, unspecified: Secondary | ICD-10-CM

## 2018-12-08 DIAGNOSIS — Z23 Encounter for immunization: Secondary | ICD-10-CM | POA: Diagnosis not present

## 2018-12-08 DIAGNOSIS — F419 Anxiety disorder, unspecified: Secondary | ICD-10-CM | POA: Diagnosis not present

## 2018-12-08 LAB — CBC WITH DIFFERENTIAL/PLATELET
Basophils Absolute: 0.1 10*3/uL (ref 0.0–0.1)
Basophils Relative: 1.3 % (ref 0.0–3.0)
Eosinophils Absolute: 0.2 10*3/uL (ref 0.0–0.7)
Eosinophils Relative: 2.8 % (ref 0.0–5.0)
HCT: 41.5 % (ref 36.0–46.0)
Hemoglobin: 13.8 g/dL (ref 12.0–15.0)
Lymphocytes Relative: 35.9 % (ref 12.0–46.0)
Lymphs Abs: 2.5 10*3/uL (ref 0.7–4.0)
MCHC: 33.3 g/dL (ref 30.0–36.0)
MCV: 90.2 fl (ref 78.0–100.0)
Monocytes Absolute: 0.6 10*3/uL (ref 0.1–1.0)
Monocytes Relative: 9.1 % (ref 3.0–12.0)
Neutro Abs: 3.6 10*3/uL (ref 1.4–7.7)
Neutrophils Relative %: 50.9 % (ref 43.0–77.0)
Platelets: 313 10*3/uL (ref 150.0–400.0)
RBC: 4.6 Mil/uL (ref 3.87–5.11)
RDW: 13.7 % (ref 11.5–15.5)
WBC: 7 10*3/uL (ref 4.0–10.5)

## 2018-12-08 LAB — URINALYSIS, ROUTINE W REFLEX MICROSCOPIC
Bilirubin Urine: NEGATIVE
Ketones, ur: NEGATIVE
Leukocytes,Ua: NEGATIVE
Nitrite: NEGATIVE
Specific Gravity, Urine: 1.02 (ref 1.000–1.030)
Total Protein, Urine: NEGATIVE
Urine Glucose: NEGATIVE
Urobilinogen, UA: 0.2 (ref 0.0–1.0)
pH: 6.5 (ref 5.0–8.0)

## 2018-12-08 LAB — HEPATIC FUNCTION PANEL
ALT: 13 U/L (ref 0–35)
AST: 13 U/L (ref 0–37)
Albumin: 4 g/dL (ref 3.5–5.2)
Alkaline Phosphatase: 104 U/L (ref 39–117)
Bilirubin, Direct: 0.1 mg/dL (ref 0.0–0.3)
Total Bilirubin: 0.3 mg/dL (ref 0.2–1.2)
Total Protein: 6.5 g/dL (ref 6.0–8.3)

## 2018-12-08 LAB — BASIC METABOLIC PANEL
BUN: 13 mg/dL (ref 6–23)
CO2: 28 mEq/L (ref 19–32)
Calcium: 8.9 mg/dL (ref 8.4–10.5)
Chloride: 105 mEq/L (ref 96–112)
Creatinine, Ser: 0.71 mg/dL (ref 0.40–1.20)
GFR: 101.52 mL/min (ref >60.00)
Glucose, Bld: 84 mg/dL (ref 70–99)
Potassium: 3.4 mEq/L — ABNORMAL LOW (ref 3.5–5.1)
Sodium: 141 mEq/L (ref 135–145)

## 2018-12-08 LAB — LIPID PANEL
Cholesterol: 198 mg/dL (ref 0–200)
HDL: 53.8 mg/dL (ref >39.00)
LDL Cholesterol: 110 mg/dL — ABNORMAL HIGH (ref 0–99)
NonHDL: 143.7
Total CHOL/HDL Ratio: 4
Triglycerides: 168 mg/dL — ABNORMAL HIGH (ref 0.0–149.0)
VLDL: 33.6 mg/dL (ref 0.0–40.0)

## 2018-12-08 LAB — HEMOGLOBIN A1C: Hgb A1c MFr Bld: 5.9 % (ref 4.6–6.5)

## 2018-12-08 LAB — TSH: TSH: 2.78 u[IU]/mL (ref 0.35–4.50)

## 2018-12-08 NOTE — Progress Notes (Signed)
Subjective:    Patient ID: [name] [name], female    DOB: [birth year], 58 y.o.   MRN: [id number]  HPI  Here for wellness and f/u;  Overall doing ok;  Pt denies Chest pain, worsening SOB, DOE, wheezing, orthopnea, PND, worsening LE edema, palpitations, dizziness or syncope.  Pt denies neurological change such as new headache, facial or extremity weakness.  Pt denies polydipsia, polyuria, or low sugar symptoms. Pt states overall good compliance with treatment and medications, good tolerability, and has been trying to follow appropriate diet.  Pt denies worsening depressive symptoms, suicidal ideation or panic, but has ongoing worsening social stressors recently.. No fever, night sweats, wt loss, loss of appetite, or other constitutional symptoms.  Pt states good ability with ADL's, has low fall risk, home safety reviewed and adequate, no other significant changes in hearing or vision, and only occasionally active with exercise. No other new complaints Past Medical History:  Diagnosis Date  . ANEMIA-NOS 12/08/2006  . BACK PAIN 12/08/2006  . BREAST HYPERTROPHY 02/05/2008  . DIVERTICULOSIS, COLON 04/18/2010  . GERD 12/08/2006  . HYPERLIPIDEMIA 12/08/2006  . HYPERTENSION 12/08/2006  . HYPOTHYROIDISM 02/05/2008  . LIPOMA 04/18/2010  . Mild mitral regurgitation by prior echocardiogram 02/07/2011  . OVERWEIGHT/OBESITY 01/11/2010  . Shortness of breath 01/12/2009  . SHOULDER PAIN, RIGHT 03/09/2007  . URI 11/23/2009  . URINALYSIS, ABNORMAL 11/23/2009   Past Surgical History:  Procedure Laterality Date  . BREAST REDUCTION SURGERY  2012  . TONSILLECTOMY AND ADENOIDECTOMY      reports that she has never smoked. She has never used smokeless tobacco. She reports current alcohol use. She reports that she does not use drugs. family history includes Cancer in her cousin and another family member; Diabetes in her mother; Hypertension in her brother, mother, and sister; Stroke (age of onset: 30) in her mother.  Allergies  Allergen Reactions  . Propoxyphene Hcl     *DARVOCET*    REACTION: nausea   Current Outpatient Medications on File Prior to Visit  Medication Sig Dispense Refill  . aspirin 81 MG EC tablet Take 1 tablet (81 mg total) by mouth daily. Swallow whole. 30 tablet 12  . cyclobenzaprine (FLEXERIL) 5 MG tablet Take 1 tablet (5 mg total) by mouth 3 (three) times daily as needed for muscle spasms. 40 tablet 1  . levothyroxine (SYNTHROID, LEVOTHROID) 50 MCG tablet TAKE 1 TABLET (50 MCG TOTAL) DAILY BY MOUTH. 90 tablet 3  . losartan-hydrochlorothiazide (HYZAAR) 50-12.5 MG tablet TAKE 1 TABLET BY MOUTH EVERY DAY 90 tablet 1  . naproxen (NAPROSYN) 500 MG tablet TAKE 1 TABLET BY MOUTH TWICE A DAY AS NEEDED 60 tablet 0  . potassium chloride (KLOR-CON M10) 10 MEQ tablet Take 1 tablet (10 mEq total) by mouth daily. 90 tablet 1  . rosuvastatin (CRESTOR) 10 MG tablet TAKE 1 TABLET (10 MG TOTAL) DAILY BY MOUTH. 90 tablet 3  . traMADol (ULTRAM) 50 MG tablet Take 1 tablet (50 mg total) by mouth every 12 (twelve) hours as needed. 6 tablet 0  . triamcinolone (NASACORT) 55 MCG/ACT AERO nasal inhaler PLACE 2 SPRAYS INTO THE NOSE DAILY.*NOT COVERED* 1 Inhaler 12   No current facility-administered medications on file prior to visit.    Review of Systems Constitutional: Negative for other unusual diaphoresis, sweats, appetite or weight changes HENT: Negative for other worsening hearing loss, ear pain, facial swelling, mouth sores or neck stiffness.   Eyes: Negative for other worsening pain, redness or other visual disturbance.  Respiratory: Negative  for other stridor or swelling Cardiovascular: Negative for other palpitations or other chest pain  Gastrointestinal: Negative for worsening diarrhea or loose stools, blood in stool, distention or other pain Genitourinary: Negative for hematuria, flank pain or other change in urine volume.  Musculoskeletal: Negative for myalgias or other joint swelling.  Skin:  Negative for other color change, or other wound or worsening drainage.  Neurological: Negative for other syncope or numbness. Hematological: Negative for other adenopathy or swelling Psychiatric/Behavioral: Negative for hallucinations, other worsening agitation, SI, self-injury, or new decreased concentration All otherwise neg per pt    Objective:   Physical Exam BP 124/82   Pulse 88   Temp 98.2 F (36.8 C) (Oral)   Ht 5\' 2"  (1.575 m)   Wt 200 lb (90.7 kg)   LMP  (LMP Unknown)   SpO2 97%   BMI 36.58 kg/m  VS noted,  Constitutional: Pt is oriented to person, place, and time. Appears well-developed and well-nourished, in no significant distress and comfortable Head: Normocephalic and atraumatic  Eyes: Conjunctivae and EOM are normal. Pupils are equal, round, and reactive to light Right Ear: External ear normal without discharge Left Ear: External ear normal without discharge Nose: Nose without discharge or deformity Mouth/Throat: Oropharynx is without other ulcerations and moist  Neck: Normal range of motion. Neck supple. No JVD present. No tracheal deviation present or significant neck LA or mass Cardiovascular: Normal rate, regular rhythm, normal heart sounds and intact distal pulses.   Pulmonary/Chest: WOB normal and breath sounds without rales or wheezing  Abdominal: Soft. Bowel sounds are normal. NT. No HSM  Musculoskeletal: Normal range of motion. Exhibits no edema Lymphadenopathy: Has no other cervical adenopathy.  Neurological: Pt is alert and oriented to person, place, and time. Pt has normal reflexes. No cranial nerve deficit. Motor grossly intact, Gait intact Skin: Skin is warm and dry. No rash noted or new ulcerations Psychiatric:  Has normal mood and affect. Behavior is normal without agitation All otherwise neg per pt Lab Results  Component Value Date   WBC 7.0 12/08/2018   HGB 13.8 12/08/2018   HCT 41.5 12/08/2018   PLT 313.0 12/08/2018   GLUCOSE 84 12/08/2018    CHOL 198 12/08/2018   TRIG 168.0 (H) 12/08/2018   HDL 53.80 12/08/2018   LDLCALC 110 (H) 12/08/2018   ALT 13 12/08/2018   AST 13 12/08/2018   NA 141 12/08/2018   K 3.4 (L) 12/08/2018   CL 105 12/08/2018   CREATININE 0.71 12/08/2018   BUN 13 12/08/2018   CO2 28 12/08/2018   TSH 2.78 12/08/2018   HGBA1C 5.9 12/08/2018   MICROALBUR 10.0 Repeated and verified X2. (H) 12/18/2015      Assessment & Plan:

## 2018-12-08 NOTE — Patient Instructions (Addendum)
You had the flu shot today., and the Tdap tetanus shot  Please continue all other medications as before, and refills have been done if requested.  Please have the pharmacy call with any other refills you may need.  Please continue your efforts at being more active, low cholesterol diet, and weight control.  You are otherwise up to date with prevention measures today.  Please keep your appointments with your specialists as you may have planned  You will be contacted regarding the referral for: Counseling  Please go to the LAB in the Basement (turn left off the elevator) for the tests to be done   You will be contacted by phone if any changes need to be made immediately.  Otherwise, you will receive a letter about your results with an explanation, but please check with MyChart first.  Please remember to sign up for MyChart if you have not done so, as this will be important to you in the future with finding out test results, communicating by private email, and scheduling acute appointments online when needed.  Please return in 1 year for your yearly visit, or sooner if needed, with Lab testing done 3-5 days before

## 2018-12-12 ENCOUNTER — Encounter: Payer: Self-pay | Admitting: Internal Medicine

## 2018-12-12 NOTE — Assessment & Plan Note (Signed)
Cont same tx, refer for counseling

## 2018-12-12 NOTE — Assessment & Plan Note (Signed)

## 2018-12-12 NOTE — Assessment & Plan Note (Signed)
stable overall by history and exam, recent data reviewed with pt, and pt to continue medical treatment as before,  to f/u any worsening symptoms or concerns  

## 2019-02-13 ENCOUNTER — Other Ambulatory Visit: Payer: Self-pay | Admitting: Internal Medicine

## 2019-02-16 ENCOUNTER — Other Ambulatory Visit: Payer: Self-pay | Admitting: Internal Medicine

## 2019-03-26 ENCOUNTER — Other Ambulatory Visit: Payer: Self-pay | Admitting: Internal Medicine

## 2019-03-26 MED ORDER — LOSARTAN POTASSIUM-HCTZ 50-12.5 MG PO TABS: 1.0000 | ORAL_TABLET | Freq: Every day | ORAL | 2 refills | Status: DC

## 2019-03-26 NOTE — Telephone Encounter (Signed)
Reviewed chart pt is up-to-date sent refills to [facility]../l,[facility]

## 2019-03-26 NOTE — Telephone Encounter (Signed)
        1. Which medications need to be refilled? (please list name of each medication and dose if known) Losartan  2. Which pharmacy/location (including street and city if local pharmacy) is medication to be sent to?[facility] [place], [place] 3. Do they need a 30 day or 90 day supply? [place]

## 2019-04-29 ENCOUNTER — Other Ambulatory Visit: Payer: Self-pay

## 2019-04-29 ENCOUNTER — Ambulatory Visit: Payer: 59 | Attending: Family

## 2019-04-29 DIAGNOSIS — Z23 Encounter for immunization: Secondary | ICD-10-CM | POA: Insufficient documentation

## 2019-04-29 NOTE — Progress Notes (Signed)
   Covid-19 Vaccination Clinic  Name:  SHANIQUAH SAYARATH    MRN: [id number] DOB: [birth year]  04/29/2019  Ms. Vanvorst was observed post Covid-19 immunization for 15 minutes without incident. She was provided with Vaccine Information Sheet and instruction to access the V-Safe system.   Ms. Simonelli was instructed to call 911 with any severe reactions post vaccine: Marland Kitchen Difficulty breathing  . Swelling of face and throat  . A fast heartbeat  . A bad rash all over body  . Dizziness and weakness   Immunizations Administered    Name Date Dose VIS Date Route   Moderna COVID-19 Vaccine 04/29/2019  5:17 PM 0.5 mL 01/26/2019 Intramuscular   Manufacturer: Moderna   Lot: [id number]   [id number]

## 2019-06-01 ENCOUNTER — Ambulatory Visit: Payer: 59 | Attending: Family

## 2019-06-01 DIAGNOSIS — Z23 Encounter for immunization: Secondary | ICD-10-CM

## 2019-06-01 NOTE — Progress Notes (Signed)
   Covid-19 Vaccination Clinic  Name:  [name] [name]    MRN: [id number] DOB: Apr 04, 1960  06/01/2019  [name] was observed post Covid-19 immunization for 15 minutes without incident. She was provided with Vaccine Information Sheet and instruction to access the V-Safe system.   [name] was instructed to call 911 with any severe reactions post vaccine: Marland Kitchen Difficulty breathing  . Swelling of face and throat  . A fast heartbeat  . A bad rash all over body  . Dizziness and weakness   Immunizations Administered    Name Date Dose VIS Date Route   Moderna COVID-19 Vaccine 06/01/2019  2:27 PM 0.5 mL 01/26/2019 Intramuscular   Manufacturer: Moderna   Lot: [id number]   [id number]

## 2019-06-02 DIAGNOSIS — L819 Disorder of pigmentation, unspecified: Secondary | ICD-10-CM | POA: Insufficient documentation

## 2019-06-02 DIAGNOSIS — L900 Lichen sclerosus et atrophicus: Secondary | ICD-10-CM | POA: Insufficient documentation

## 2019-06-02 DIAGNOSIS — L811 Chloasma: Secondary | ICD-10-CM | POA: Insufficient documentation

## 2019-06-04 ENCOUNTER — Encounter: Payer: Self-pay | Admitting: Gastroenterology

## 2019-07-19 ENCOUNTER — Other Ambulatory Visit: Payer: Self-pay | Admitting: Internal Medicine

## 2019-07-19 ENCOUNTER — Encounter: Payer: Self-pay | Admitting: Internal Medicine

## 2019-07-21 ENCOUNTER — Telehealth: Payer: Self-pay

## 2019-07-21 NOTE — Telephone Encounter (Signed)
Pt was NS for PV.  Attempted to reach pt but went to VM withj VM box full.  Appts were cancelled and NS Letter sent

## 2019-08-04 ENCOUNTER — Encounter: Payer: 59 | Admitting: Gastroenterology

## 2019-09-26 ENCOUNTER — Other Ambulatory Visit: Payer: Self-pay | Admitting: Internal Medicine

## 2019-09-26 NOTE — Telephone Encounter (Signed)
Please refill as per office routine med refill policy (all routine meds refilled for 3 mo or monthly per pt preference up to one year from last visit, then month to month grace period for 3 mo, then further med refills will have to be denied)  

## 2019-10-04 ENCOUNTER — Ambulatory Visit (AMBULATORY_SURGERY_CENTER): Payer: Self-pay | Admitting: *Deleted

## 2019-10-04 ENCOUNTER — Encounter: Payer: Self-pay | Admitting: Gastroenterology

## 2019-10-04 ENCOUNTER — Other Ambulatory Visit: Payer: Self-pay

## 2019-10-04 VITALS — Ht 62.0 in | Wt 205.0 lb

## 2019-10-04 DIAGNOSIS — Z1211 Encounter for screening for malignant neoplasm of colon: Secondary | ICD-10-CM

## 2019-10-04 MED ORDER — SUTAB 1479-225-188 MG PO TABS: 1.0000 | ORAL_TABLET | Freq: Once | ORAL | 0 refills | Status: AC

## 2019-10-04 NOTE — Progress Notes (Signed)
Completed covid vaccine 06-01-19/KW  Pt is aware that care partner will wait in the car during procedure; if they feel like they will be too hot or cold to wait in the car; they may wait in the 4 th floor lobby. Patient is aware to bring only one care partner. We want them to wear a mask (we do not have any that we can provide them), practice social distancing, and we will check their temperatures when they get here.  I did remind the patient that their care partner needs to stay in the parking lot the entire time and have a cell phone available, we will call them when the pt is ready for discharge. Patient will wear mask into building.   No trouble with anesthesia, difficulty with intubation or hx/fam hx of malignant hyperthermia per pt   No egg or soy allergy  No home oxygen use   No medications for weight loss taken  emmi information given  Pt denies constipation issues    Sutab code put into RX and paper copy given to pt to show pharmacy
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
## 2019-10-18 ENCOUNTER — Other Ambulatory Visit: Payer: Self-pay

## 2019-10-18 ENCOUNTER — Ambulatory Visit (AMBULATORY_SURGERY_CENTER): Payer: 59 | Admitting: Gastroenterology

## 2019-10-18 ENCOUNTER — Encounter: Payer: Self-pay | Admitting: Gastroenterology

## 2019-10-18 VITALS — BP 111/53 | HR 67 | Temp 98.7°F | Resp 24 | Ht 62.0 in | Wt 205.0 lb

## 2019-10-18 DIAGNOSIS — Z1211 Encounter for screening for malignant neoplasm of colon: Secondary | ICD-10-CM | POA: Diagnosis present

## 2019-10-18 DIAGNOSIS — D122 Benign neoplasm of ascending colon: Secondary | ICD-10-CM

## 2019-10-18 MED ORDER — SODIUM CHLORIDE 0.9 % IV SOLN: 500.0000 mL | Freq: Once | INTRAVENOUS | Status: DC

## 2019-10-18 NOTE — Progress Notes (Signed)
VS by CW  Pt's states no medical or surgical changes since previsit or office visit.  

## 2019-10-18 NOTE — Patient Instructions (Signed)
Handouts given:  High fiber diet, polyps, Diverticulosis PLEASE START HIGH FIBER DIET CONTINUE PRESENT MEDICATIONS AWAIT PATHOLOGY RESULTS   YOU HAD AN ENDOSCOPIC PROCEDURE TODAY AT [facility]:   Refer to the procedure report that was given to you for any specific questions about what was found during the examination.  If the procedure report does not answer your questions, please call your gastroenterologist to clarify.  If you requested that your care partner not be given the details of your procedure findings, then the procedure report has been included in a sealed envelope for you to review at your convenience later.  YOU SHOULD EXPECT: Some feelings of bloating in the abdomen. Passage of more gas than usual.  Walking can help get rid of the air that was put into your GI tract during the procedure and reduce the bloating. If you had a lower endoscopy (such as a colonoscopy or flexible sigmoidoscopy) you may notice spotting of blood in your stool or on the toilet paper. If you underwent a bowel prep for your procedure, you may not have a normal bowel movement for a few days.  Please Note:  You might notice some irritation and congestion in your nose or some drainage.  This is from the oxygen used during your procedure.  There is no need for concern and it should clear up in a day or so.  SYMPTOMS TO REPORT IMMEDIATELY:   Following lower endoscopy (colonoscopy or flexible sigmoidoscopy):  Excessive amounts of blood in the stool  Significant tenderness or worsening of abdominal pains  Swelling of the abdomen that is new, acute  Fever of 100F or higher  For urgent or emergent issues, a gastroenterologist can be reached at any hour by calling [phone]. Do not use MyChart messaging for urgent concerns.    DIET:  We do recommend a small meal at first, but then you may proceed to your regular diet.  Drink plenty of fluids but you should avoid alcoholic beverages for 24  hours.  ACTIVITY:  You should plan to take it easy for the rest of today and you should NOT DRIVE or use heavy machinery until tomorrow (because of the sedation medicines used during the test).    FOLLOW UP: Our staff will call the number listed on your records 48-72 hours following your procedure to check on you and address any questions or concerns that you may have regarding the information given to you following your procedure. If we do not reach you, we will leave a message.  We will attempt to reach you two times.  During this call, we will ask if you have developed any symptoms of COVID 19. If you develop any symptoms (ie: fever, flu-like symptoms, shortness of breath, cough etc.) before then, please call [phone].  If you test positive for Covid 19 in the 2 weeks post procedure, please call and report this information to [place].    If any biopsies were taken you will be contacted by phone or by letter within the next 1-3 weeks.  Please call us at [phone] if you have not heard about the biopsies in 3 weeks.    SIGNATURES/CONFIDENTIALITY: You and/or your care partner have signed paperwork which will be entered into your electronic medical record.  These signatures attest to the fact that that the information above on your After Visit Summary has been reviewed and is understood.  Full responsibility of the confidentiality of this discharge information lies with you and/or  your care-partner. 

## 2019-10-18 NOTE — Progress Notes (Signed)
PT taken to PACU. Monitors in place. VSS. Report given to RN. 

## 2019-10-18 NOTE — Progress Notes (Signed)
Called to room to assist during endoscopic procedure.  Patient ID and intended procedure confirmed with present staff. Received instructions for my participation in the procedure from the performing physician.  

## 2019-10-18 NOTE — Op Note (Signed)
[facility] Patient Name: [name] [name] Procedure Date: 10/18/2019 7:53 AM MRN: [id number] Endoscopist: Thornton Park MD, MD Age: 59 Referring MD:  Date of Birth: 03-31-1960 Gender: Female Account #: [id number] Procedure:                Colonoscopy Indications:              Screening for colorectal malignant neoplasm                           No polyps on colonoscopy with Dr. Deatra Ina in 2011                           No known family history of colon cancer or polyps Medicines:                Monitored Anesthesia Care Procedure:                Pre-Anesthesia Assessment:                           - Prior to the procedure, a History and Physical                            was performed, and patient medications and                            allergies were reviewed. The patient's tolerance of                            previous anesthesia was also reviewed. The risks                            and benefits of the procedure and the sedation                            options and risks were discussed with the patient.                            All questions were answered, and informed consent                            was obtained. Prior Anticoagulants: The patient has                            taken no previous anticoagulant or antiplatelet                            agents. ASA Grade Assessment: II - A patient with                            mild systemic disease. After reviewing the risks                            and benefits, the patient was deemed in  satisfactory condition to undergo the procedure.                           After obtaining informed consent, the colonoscope                            was passed under direct vision. Throughout the                            procedure, the patient's blood pressure, pulse, and                            oxygen saturations were monitored continuously. The                            Colonoscope  was introduced through the anus and                            advanced to the 3 cm into the ileum. A second                            forward view of the right colon was reviewed. The                            colonoscopy was performed without difficulty. The                            patient tolerated the procedure well. The quality                            of the bowel preparation was good. The terminal                            ileum, ileocecal valve, appendiceal orifice, and                            rectum were photographed. Scope In: 8:13:21 AM Scope Out: 8:25:19 AM Scope Withdrawal Time: 0 hours 10 minutes 17 seconds  Total Procedure Duration: 0 hours 11 minutes 58 seconds  Findings:                 The perianal and digital rectal examinations were                            normal.                           Multiple small and large-mouthed diverticula were                            found in the sigmoid colon.                           Two sessile polyps were found in the ascending  colon. The polyps were 2 to 3 mm in size. These                            polyps were removed with a cold snare. Resection                            and retrieval were complete. Estimated blood loss                            was minimal. Complications:            No immediate complications. Estimated blood loss:                            Minimal. Estimated Blood Loss:     Estimated blood loss was minimal. Impression:               - Diverticulosis in the sigmoid colon.                           - Two 2 to 3 mm polyps in the ascending colon,                            removed with a cold snare. Resected and retrieved. Recommendation:           - Patient has a contact number available for                            emergencies. The signs and symptoms of potential                            delayed complications were discussed with the                             patient. Return to normal activities tomorrow.                            Written discharge instructions were provided to the                            patient.                           - Follow a high fiber diet. Drink at least 64                            ounces of water daily. Add a daily stool bulking                            agent such as psyllium (an exampled would be                            Metamucil).                           -  Continue present medications.                           - Await pathology results.                           - Repeat colonoscopy date to be determined after                            pending pathology results are reviewed for                            surveillance. If both polyps are tubular adenomas,                            will repeat colonoscopy in 7 years.                           - Emerging evidence supports eating a diet of                            fruits, vegetables, grains, calcium, and yogurt                            while reducing red meat and alcohol may reduce the                            risk of colon cancer.                           - Thank you for allowing me to be involved in your                            colon cancer prevention. Thornton Park MD, MD 10/18/2019 8:34:04 AM This report has been signed electronically.

## 2019-10-20 ENCOUNTER — Telehealth: Payer: Self-pay

## 2019-10-20 NOTE — Telephone Encounter (Signed)
°  Follow up Call-  Call back number 10/18/2019  Post procedure Call Back phone  # [phone]  Permission to leave phone message Yes  Some recent data might be hidden     Patient questions:  Do you have a fever, pain , or abdominal swelling? No. Pain Score  0 *  Have you tolerated food without any problems? Yes.    Have you been able to return to your normal activities? Yes.    Do you have any questions about your discharge instructions: Diet   No. Medications  No. Follow up visit  No.  Do you have questions or concerns about your Care? No.  Actions: * If pain score is 4 or above: No action needed, pain <4. 1. Have you developed a fever since your procedure? no  2.   Have you had an respiratory symptoms (SOB or cough) since your procedure? no  3.   Have you tested positive for COVID 19 since your procedure no  4.   Have you had any family members/close contacts diagnosed with the COVID 19 since your procedure?  no   If yes to any of these questions please route to Joylene John, RN and Joella Prince, RN

## 2019-10-21 ENCOUNTER — Encounter: Payer: Self-pay | Admitting: Gastroenterology

## 2019-11-18 ENCOUNTER — Other Ambulatory Visit: Payer: Self-pay | Admitting: Internal Medicine

## 2019-11-23 ENCOUNTER — Other Ambulatory Visit: Payer: Self-pay | Admitting: Internal Medicine

## 2019-11-23 NOTE — Telephone Encounter (Signed)
Please refill as per office routine med refill policy (all routine meds refilled for 3 mo or monthly per pt preference up to one year from last visit, then month to month grace period for 3 mo, then further med refills will have to be denied)  

## 2020-01-07 ENCOUNTER — Ambulatory Visit: Payer: 59 | Attending: Internal Medicine

## 2020-01-07 DIAGNOSIS — Z23 Encounter for immunization: Secondary | ICD-10-CM

## 2020-01-07 NOTE — Progress Notes (Signed)
   Covid-19 Vaccination Clinic  Name:  [name] [name]    MRN: [id number] DOB: 02/26/1960  01/07/2020  [name] was observed post Covid-19 immunization for 15 minutes without incident. She was provided with Vaccine Information Sheet and instruction to access the V-Safe system.   [name] was instructed to call 911 with any severe reactions post vaccine: Marland Kitchen Difficulty breathing  . Swelling of face and throat  . A fast heartbeat  . A bad rash all over body  . Dizziness and weakness

## 2020-01-11 ENCOUNTER — Encounter: Payer: 59 | Admitting: Internal Medicine

## 2020-01-25 ENCOUNTER — Encounter: Payer: 59 | Admitting: Internal Medicine

## 2020-02-28 ENCOUNTER — Encounter: Payer: 59 | Admitting: Internal Medicine

## 2020-03-29 ENCOUNTER — Encounter: Payer: 59 | Admitting: Internal Medicine

## 2020-04-07 ENCOUNTER — Other Ambulatory Visit: Payer: Self-pay | Admitting: Internal Medicine

## 2020-04-07 NOTE — Telephone Encounter (Signed)
Please refill as per office routine med refill policy (all routine meds refilled for 3 mo or monthly per pt preference up to one year from last visit, then month to month grace period for 3 mo, then further med refills will have to be denied)  

## 2020-06-18 ENCOUNTER — Other Ambulatory Visit: Payer: Self-pay | Admitting: Internal Medicine

## 2020-06-18 NOTE — Telephone Encounter (Signed)
Please refill as per office routine med refill policy (all routine meds refilled for 3 mo or monthly per pt preference up to one year from last visit, then month to month grace period for 3 mo, then further med refills will have to be denied)  

## 2020-06-20 ENCOUNTER — Encounter: Payer: 59 | Admitting: Internal Medicine

## 2020-07-04 ENCOUNTER — Encounter: Payer: 59 | Admitting: Internal Medicine

## 2020-07-12 ENCOUNTER — Other Ambulatory Visit: Payer: Self-pay | Admitting: Internal Medicine

## 2020-07-12 NOTE — Telephone Encounter (Signed)
Sorry no refill  Needs ROV

## 2020-07-14 NOTE — Telephone Encounter (Signed)
Patient wondering since she has an appointment for the 27th if we can send in a short supply

## 2020-07-16 ENCOUNTER — Encounter: Payer: Self-pay | Admitting: Internal Medicine

## 2020-07-16 DIAGNOSIS — R739 Hyperglycemia, unspecified: Secondary | ICD-10-CM

## 2020-07-16 DIAGNOSIS — E559 Vitamin D deficiency, unspecified: Secondary | ICD-10-CM

## 2020-07-16 DIAGNOSIS — E782 Mixed hyperlipidemia: Secondary | ICD-10-CM

## 2020-07-16 DIAGNOSIS — E538 Deficiency of other specified B group vitamins: Secondary | ICD-10-CM

## 2020-07-17 MED ORDER — GUAIFENESIN ER 600 MG PO TB12: 1200.0000 mg | ORAL_TABLET | Freq: Two times a day (BID) | ORAL | 0 refills | Status: DC | PRN

## 2020-07-18 MED ORDER — MOLNUPIRAVIR EUA 200MG CAPSULE: 4.0000 | ORAL_CAPSULE | Freq: Two times a day (BID) | ORAL | 0 refills | Status: AC

## 2020-07-18 NOTE — Addendum Note (Signed)
Addended by: Biagio Borg on: 07/18/2020 12:56 PM   Modules accepted: Orders

## 2020-07-21 ENCOUNTER — Encounter: Payer: 59 | Admitting: Internal Medicine

## 2020-08-04 LAB — HM MAMMOGRAPHY

## 2020-08-08 ENCOUNTER — Other Ambulatory Visit (INDEPENDENT_AMBULATORY_CARE_PROVIDER_SITE_OTHER): Payer: 59

## 2020-08-08 DIAGNOSIS — E559 Vitamin D deficiency, unspecified: Secondary | ICD-10-CM | POA: Diagnosis not present

## 2020-08-08 DIAGNOSIS — E538 Deficiency of other specified B group vitamins: Secondary | ICD-10-CM

## 2020-08-08 DIAGNOSIS — E782 Mixed hyperlipidemia: Secondary | ICD-10-CM

## 2020-08-08 DIAGNOSIS — R739 Hyperglycemia, unspecified: Secondary | ICD-10-CM | POA: Diagnosis not present

## 2020-08-08 LAB — CBC WITH DIFFERENTIAL/PLATELET
Basophils Absolute: 0 10*3/uL (ref 0.0–0.1)
Basophils Relative: 0.5 % (ref 0.0–3.0)
Eosinophils Absolute: 0.3 10*3/uL (ref 0.0–0.7)
Eosinophils Relative: 3.8 % (ref 0.0–5.0)
HCT: 41.5 % (ref 36.0–46.0)
Hemoglobin: 13.7 g/dL (ref 12.0–15.0)
Lymphocytes Relative: 35.3 % (ref 12.0–46.0)
Lymphs Abs: 2.4 10*3/uL (ref 0.7–4.0)
MCHC: 33 g/dL (ref 30.0–36.0)
MCV: 90.1 fl (ref 78.0–100.0)
Monocytes Absolute: 0.6 10*3/uL (ref 0.1–1.0)
Monocytes Relative: 8.2 % (ref 3.0–12.0)
Neutro Abs: 3.6 10*3/uL (ref 1.4–7.7)
Neutrophils Relative %: 52.2 % (ref 43.0–77.0)
Platelets: 299 10*3/uL (ref 150.0–400.0)
RBC: 4.61 Mil/uL (ref 3.87–5.11)
RDW: 13.9 % (ref 11.5–15.5)
WBC: 6.9 10*3/uL (ref 4.0–10.5)

## 2020-08-08 LAB — VITAMIN D 25 HYDROXY (VIT D DEFICIENCY, FRACTURES): VITD: 13.38 ng/mL — ABNORMAL LOW (ref 30.00–100.00)

## 2020-08-08 LAB — LIPID PANEL
Cholesterol: 135 mg/dL (ref 0–200)
HDL: 56.1 mg/dL (ref >39.00)
LDL Cholesterol: 67 mg/dL (ref 0–99)
NonHDL: 78.99
Total CHOL/HDL Ratio: 2
Triglycerides: 62 mg/dL (ref 0.0–149.0)
VLDL: 12.4 mg/dL (ref 0.0–40.0)

## 2020-08-08 LAB — BASIC METABOLIC PANEL
BUN: 13 mg/dL (ref 6–23)
CO2: 29 mEq/L (ref 19–32)
Calcium: 8.8 mg/dL (ref 8.4–10.5)
Chloride: 105 mEq/L (ref 96–112)
Creatinine, Ser: 0.67 mg/dL (ref 0.40–1.20)
GFR: 94 mL/min (ref >60.00)
Glucose, Bld: 88 mg/dL (ref 70–99)
Potassium: 3.4 mEq/L — ABNORMAL LOW (ref 3.5–5.1)
Sodium: 141 mEq/L (ref 135–145)

## 2020-08-08 LAB — URINALYSIS, ROUTINE W REFLEX MICROSCOPIC
Ketones, ur: NEGATIVE
Nitrite: NEGATIVE
Specific Gravity, Urine: >=1.03 — AB (ref 1.000–1.030)
Total Protein, Urine: NEGATIVE
Urine Glucose: NEGATIVE
Urobilinogen, UA: 0.2 (ref 0.0–1.0)
pH: 5.5 (ref 5.0–8.0)

## 2020-08-08 LAB — HEPATIC FUNCTION PANEL
ALT: 15 U/L (ref 0–35)
AST: 15 U/L (ref 0–37)
Albumin: 3.9 g/dL (ref 3.5–5.2)
Alkaline Phosphatase: 99 U/L (ref 39–117)
Bilirubin, Direct: 0.1 mg/dL (ref 0.0–0.3)
Total Bilirubin: 0.6 mg/dL (ref 0.2–1.2)
Total Protein: 6.8 g/dL (ref 6.0–8.3)

## 2020-08-08 LAB — HEMOGLOBIN A1C: Hgb A1c MFr Bld: 6.1 % (ref 4.6–6.5)

## 2020-08-08 LAB — VITAMIN B12: Vitamin B-12: 346 pg/mL (ref 211–911)

## 2020-08-08 LAB — TSH: TSH: 4.6 u[IU]/mL — ABNORMAL HIGH (ref 0.35–4.50)

## 2020-08-09 ENCOUNTER — Other Ambulatory Visit: Payer: Self-pay

## 2020-08-09 ENCOUNTER — Other Ambulatory Visit: Payer: Self-pay | Admitting: Internal Medicine

## 2020-08-09 ENCOUNTER — Encounter: Payer: Self-pay | Admitting: Internal Medicine

## 2020-08-09 ENCOUNTER — Ambulatory Visit (INDEPENDENT_AMBULATORY_CARE_PROVIDER_SITE_OTHER): Payer: 59 | Admitting: Internal Medicine

## 2020-08-09 VITALS — BP 150/98 | HR 84 | Temp 98.8°F | Ht 62.0 in | Wt 198.0 lb

## 2020-08-09 DIAGNOSIS — O009 Unspecified ectopic pregnancy without intrauterine pregnancy: Secondary | ICD-10-CM | POA: Insufficient documentation

## 2020-08-09 DIAGNOSIS — Z23 Encounter for immunization: Secondary | ICD-10-CM | POA: Diagnosis not present

## 2020-08-09 DIAGNOSIS — N390 Urinary tract infection, site not specified: Secondary | ICD-10-CM

## 2020-08-09 DIAGNOSIS — R079 Chest pain, unspecified: Secondary | ICD-10-CM | POA: Diagnosis not present

## 2020-08-09 DIAGNOSIS — E559 Vitamin D deficiency, unspecified: Secondary | ICD-10-CM

## 2020-08-09 DIAGNOSIS — N649 Disorder of breast, unspecified: Secondary | ICD-10-CM | POA: Insufficient documentation

## 2020-08-09 DIAGNOSIS — E669 Obesity, unspecified: Secondary | ICD-10-CM | POA: Insufficient documentation

## 2020-08-09 DIAGNOSIS — R739 Hyperglycemia, unspecified: Secondary | ICD-10-CM | POA: Diagnosis not present

## 2020-08-09 DIAGNOSIS — D259 Leiomyoma of uterus, unspecified: Secondary | ICD-10-CM | POA: Insufficient documentation

## 2020-08-09 DIAGNOSIS — R87619 Unspecified abnormal cytological findings in specimens from cervix uteri: Secondary | ICD-10-CM | POA: Insufficient documentation

## 2020-08-09 DIAGNOSIS — M79671 Pain in right foot: Secondary | ICD-10-CM

## 2020-08-09 DIAGNOSIS — Z0001 Encounter for general adult medical examination with abnormal findings: Secondary | ICD-10-CM | POA: Diagnosis not present

## 2020-08-09 MED ORDER — CHOLECALCIFEROL 50 MCG (2000 UT) PO TABS: ORAL_TABLET | ORAL | 99 refills | Status: DC

## 2020-08-09 MED ORDER — CIPROFLOXACIN HCL 500 MG PO TABS: 500.0000 mg | ORAL_TABLET | Freq: Two times a day (BID) | ORAL | 0 refills | Status: AC

## 2020-08-09 NOTE — Progress Notes (Signed)
Patient ID: [name] [name], female   DOB: 1960-07-27, 60 y.o.   MRN: [id number]         Chief Complaint:: wellness exam and low vit d, urinary freqeucny, htn, CP and right foot pain       HPI:  [name] [name] is a 60 y.o. female here for wellness exam; due for shigrix #1 today;  o/w up to date with preventive referrals and immunizations  Also has GYN appt on aug 2.        Also not taking Vit D.  Has 3 days onset urinary frequency and burning but Denies urinary symptoms such as urgency, flank pain, hematuria or n/v, fever, chills.  BP has been < 104/90 at home but not checked recently, but plans to restart checking.  Has has over 1 mo of seemingly mild random fleeting sharp anterior cp in multiple areas, without radiation, n/v,diaphoresis, palps or dizziness, and non exertional, non pleuritic non positional.  Also with right mid foot pain sharp mild to mod more than 6 mo, gradually worsening persistent without swelling or skin change, walking makes worse, resting is better, now getting to be more activity limiting.    Wt Readings from Last 3 Encounters:  08/09/20 198 lb (89.8 kg)  10/18/19 205 lb (93 kg)  10/04/19 205 lb (93 kg)   BP Readings from Last 3 Encounters:  08/09/20 (!) 150/98  10/18/19 (!) 111/53  12/08/18 124/82   Immunization History  Administered Date(s) Administered   Influenza Split 01/29/2011, 02/26/2012   Influenza Whole 12/29/2007, 11/25/2008   Influenza,inj,Quad PF,6+ Mos 12/08/2013, 12/15/2014, 01/09/2018, 12/08/2018   Influenza-Unspecified 11/25/2016   Moderna SARS-COV2 Booster Vaccination 01/07/2020   Moderna Sars-Covid-2 Vaccination 04/29/2019, 06/01/2019   Td 02/05/2008   Tdap 12/08/2018   Zoster Recombinat (Shingrix) 08/09/2020   Health Maintenance Due  Topic Date Due   PAP SMEAR-Modifier  12/25/2019      Past Medical History:  Diagnosis Date   ANEMIA-NOS 12/08/2006   BACK PAIN 12/08/2006   BREAST HYPERTROPHY 02/05/2008   DIVERTICULOSIS, COLON  04/18/2010   GERD 12/08/2006   Heart murmur    HYPERLIPIDEMIA 12/08/2006   HYPERTENSION 12/08/2006   HYPOTHYROIDISM 02/05/2008   LIPOMA 04/18/2010   Mild mitral regurgitation by prior echocardiogram 02/07/2011   OVERWEIGHT/OBESITY 01/11/2010   Shortness of breath 01/12/2009   SHOULDER PAIN, RIGHT 03/09/2007   URI 11/23/2009   URINALYSIS, ABNORMAL 11/23/2009   Past Surgical History:  Procedure Laterality Date   BREAST REDUCTION SURGERY  2012   CESAREAN SECTION     COLONOSCOPY     TONSILLECTOMY AND ADENOIDECTOMY      reports that she has never smoked. She has never used smokeless tobacco. She reports current alcohol use. She reports that she does not use drugs. family history includes Cancer in her cousin and another family member; Diabetes in her mother; Hypertension in her brother, mother, and sister; Stroke (age of onset: 14) in her mother. Allergies  Allergen Reactions   Propoxyphene Hcl     *DARVOCET*    REACTION: nausea   Current Outpatient Medications on File Prior to Visit  Medication Sig Dispense Refill   aspirin 81 MG EC tablet Take 1 tablet (81 mg total) by mouth daily. Swallow whole. 30 tablet 12   cyclobenzaprine (FLEXERIL) 5 MG tablet Take 1 tablet (5 mg total) by mouth 3 (three) times daily as needed for muscle spasms. 40 tablet 1   guaiFENesin (MUCINEX) 600 MG 12 hr tablet Take 2 tablets (1,200 mg  total) by mouth 2 (two) times daily as needed. 60 tablet 0   losartan-hydrochlorothiazide (HYZAAR) 50-12.5 MG tablet TAKE 1 TABLET BY MOUTH DAILY 90 tablet 2   naproxen (NAPROSYN) 500 MG tablet TAKE 1 TABLET BY MOUTH TWICE A DAY AS NEEDED 60 tablet 0   traMADol (ULTRAM) 50 MG tablet Take 1 tablet (50 mg total) by mouth every 12 (twelve) hours as needed. 6 tablet 0   triamcinolone (NASACORT) 55 MCG/ACT AERO nasal inhaler INSTILL 2 SPRAYS INTO NOSTRIL DAILY (Patient taking differently: PRN) 1 Inhaler 13   hydrocortisone 2.5 % cream Apply topically 2 (two) times daily.      hydroquinone 4 % cream Apply topically at bedtime.     ID NOW COVID-19 KIT See admin instructions. for testing     pimecrolimus (ELIDEL) 1 % cream Apply topically daily.     triamcinolone cream (KENALOG) 0.1 % Apply topically.     No current facility-administered medications on file prior to visit.        ROS:  All others reviewed and negative.  Objective        PE:  BP (!) 150/98 (BP Location: Left Arm, Patient Position: Sitting, Cuff Size: Large)   Pulse 84   Temp 98.8 F (37.1 C) (Oral)   Ht _0  (1.575 m)   Wt 198 lb (89.8 kg)   LMP  (LMP Unknown)   SpO2 96%   BMI 36.21 kg/m                 Constitutional: Pt appears in NAD               HENT: Head: NCAT.                Right Ear: External ear normal.                 Left Ear: External ear normal.                Eyes: . Pupils are equal, round, and reactive to light. Conjunctivae and EOM are normal               Nose: without d/c or deformity               Neck: Neck supple. Gross normal ROM               Cardiovascular: Normal rate and regular rhythm.                 Pulmonary/Chest: Effort normal and breath sounds without rales or wheezing.                Abd:  Soft, NT, ND, + BS, no organomegaly               Neurological: Pt is alert. At baseline orientation, motor grossly intact               Skin: Skin is warm. No rashes, no other new lesions, LE edema - none               Psychiatric: Pt behavior is normal without agitation   Micro: none  Cardiac tracings I have personally interpreted today:  none  Pertinent Radiological findings (summarize): none   Lab Results  Component Value Date   WBC 6.9 08/08/2020   HGB 13.7 08/08/2020   HCT 41.5 08/08/2020   PLT 299.0 08/08/2020   GLUCOSE 88 08/08/2020   CHOL 135 08/08/2020   TRIG 62.0  08/08/2020   HDL 56.10 08/08/2020   LDLCALC 67 08/08/2020   ALT 15 08/08/2020   AST 15 08/08/2020   NA 141 08/08/2020   K 3.4 (L) 08/08/2020   CL 105 08/08/2020   CREATININE  0.67 08/08/2020   BUN 13 08/08/2020   CO2 29 08/08/2020   TSH 4.60 (H) 08/08/2020   HGBA1C 6.1 08/08/2020   MICROALBUR 10.0 Repeated and verified X2. (H) 12/18/2015   Assessment/Plan:  [name] [name] is a 60 y.o. Black or African American [2] female with  has a past medical history of ANEMIA-NOS (12/08/2006), BACK PAIN (12/08/2006), BREAST HYPERTROPHY (02/05/2008), DIVERTICULOSIS, COLON (04/18/2010), GERD (12/08/2006), Heart murmur, HYPERLIPIDEMIA (12/08/2006), HYPERTENSION (12/08/2006), HYPOTHYROIDISM (02/05/2008), LIPOMA (04/18/2010), Mild mitral regurgitation by prior echocardiogram (02/07/2011), OVERWEIGHT/OBESITY (01/11/2010), Shortness of breath (01/12/2009), SHOULDER PAIN, RIGHT (03/09/2007), URI (11/23/2009), and URINALYSIS, ABNORMAL (11/23/2009).  Encounter for well adult exam with abnormal findings Age and sex appropriate education and counseling updated with regular exercise and diet Referrals for preventative services - pt to call for GYN appt Immunizations addressed - for shingrix Smoking counseling  - none needed Evidence for depression or other mood disorder - none significant Most recent labs reviewed. I have personally reviewed and have noted: 1) the patient's medical and social history 2) The patient's current medications and supplements 3) The patient's height, weight, and BMI have been recorded in the chart   Hyperglycemia Lab Results  Component Value Date   HGBA1C 6.1 08/08/2020   Stable, pt to continue current medical treatment  - diet   Foot pain, right Etiology unclear, ? djd vs other  - for ortho referral, dr hewitt, cont o/w same tx  Vitamin D deficiency Last vitamin D Lab Results  Component Value Date   VD25OH 13.38 (L) 08/08/2020   Low, to start oral replacement   Chest pain Atypical, etiology unclear, very low suspicion for cardiac, for cxr  Urinary tract infectious disease Likely uti today, for urine studies, empiric cipro course  Followup:  Return in about 1 year (around 08/09/2021).  Cathlean Cower, MD 08/13/2020 9:39 AM [facility]

## 2020-08-09 NOTE — Patient Instructions (Signed)
Please take OTC Vitamin D3 at 2000 units per day, indefinitely  Please take all new medication as prescribed  - the cipro antibiotic  Please continue to monitor your BP at home with a goal to be less than at least 140/90  Please continue all other medications as before, and refills have been done if requested.  Please have the pharmacy call with any other refills you may need.  Please continue your efforts at being more active, low cholesterol diet, and weight control.  You are otherwise up to date with prevention measures today.  Please keep your appointments with your specialists as you may have planned

## 2020-08-09 NOTE — Telephone Encounter (Signed)
Please refill as per office routine med refill policy (all routine meds refilled for 3 mo or monthly per pt preference up to one year from last visit, then month to month grace period for 3 mo, then further med refills will have to be denied)  

## 2020-08-10 ENCOUNTER — Other Ambulatory Visit: Payer: Self-pay | Admitting: Internal Medicine

## 2020-08-10 ENCOUNTER — Other Ambulatory Visit: Payer: Self-pay

## 2020-08-10 MED ORDER — POTASSIUM CHLORIDE CRYS ER 10 MEQ PO TBCR: 10.0000 meq | EXTENDED_RELEASE_TABLET | Freq: Every day | ORAL | 0 refills | Status: DC

## 2020-08-13 DIAGNOSIS — E559 Vitamin D deficiency, unspecified: Secondary | ICD-10-CM | POA: Insufficient documentation

## 2020-08-13 DIAGNOSIS — M79671 Pain in right foot: Secondary | ICD-10-CM | POA: Insufficient documentation

## 2020-08-13 NOTE — Assessment & Plan Note (Addendum)
Lab Results  Component Value Date   HGBA1C 6.1 08/08/2020   Stable, pt to continue current medical treatment  - diet

## 2020-08-13 NOTE — Assessment & Plan Note (Signed)
Etiology unclear, ? djd vs other  - for ortho referral, dr hewitt, cont o/w same tx

## 2020-08-13 NOTE — Assessment & Plan Note (Signed)
Likely uti today, for urine studies, empiric cipro course

## 2020-08-13 NOTE — Assessment & Plan Note (Signed)
Age and sex appropriate education and counseling updated with regular exercise and diet Referrals for preventative services - pt to call for GYN appt Immunizations addressed - for shingrix Smoking counseling  - none needed Evidence for depression or other mood disorder - none significant Most recent labs reviewed. I have personally reviewed and have noted: 1) the patient's medical and social history 2) The patient's current medications and supplements 3) The patient's height, weight, and BMI have been recorded in the chart

## 2020-08-13 NOTE — Assessment & Plan Note (Signed)
Last vitamin D Lab Results  Component Value Date   VD25OH 13.38 (L) 08/08/2020   Low, to start oral replacement

## 2020-08-13 NOTE — Assessment & Plan Note (Signed)
Atypical, etiology unclear, very low suspicion for cardiac, for cxr

## 2020-08-14 ENCOUNTER — Other Ambulatory Visit: Payer: Self-pay | Admitting: Internal Medicine

## 2020-08-14 NOTE — Telephone Encounter (Signed)
Please refill as per office routine med refill policy (all routine meds refilled for 3 mo or monthly per pt preference up to one year from last visit, then month to month grace period for 3 mo, then further med refills will have to be denied)  

## 2020-09-05 ENCOUNTER — Other Ambulatory Visit: Payer: Self-pay

## 2020-09-05 MED ORDER — LOSARTAN POTASSIUM-HCTZ 50-12.5 MG PO TABS: 1.0000 | ORAL_TABLET | Freq: Every day | ORAL | 2 refills | Status: DC

## 2020-09-08 ENCOUNTER — Encounter: Payer: Self-pay | Admitting: Internal Medicine

## 2020-09-11 DIAGNOSIS — R2240 Localized swelling, mass and lump, unspecified lower limb: Secondary | ICD-10-CM | POA: Insufficient documentation

## 2020-09-17 ENCOUNTER — Other Ambulatory Visit: Payer: Self-pay | Admitting: Internal Medicine

## 2020-10-02 ENCOUNTER — Encounter: Payer: Self-pay | Admitting: Internal Medicine

## 2020-10-17 NOTE — Telephone Encounter (Signed)
Error

## 2020-10-18 ENCOUNTER — Telehealth: Payer: Self-pay

## 2020-10-18 MED ORDER — POTASSIUM CHLORIDE CRYS ER 10 MEQ PO TBCR: 10.0000 meq | EXTENDED_RELEASE_TABLET | Freq: Every day | ORAL | 2 refills | Status: DC

## 2020-10-18 NOTE — Telephone Encounter (Signed)
Potassium pill is on list, and was refilled to [facility] in [place]

## 2020-10-28 ENCOUNTER — Other Ambulatory Visit: Payer: Self-pay | Admitting: Internal Medicine

## 2020-10-28 NOTE — Telephone Encounter (Signed)
Please refill as per office routine med refill policy (all routine meds to be refilled for 3 mo or monthly (per pt preference) up to one year from last visit, then month to month grace period for 3 mo, then further med refills will have to be denied) ? ?

## 2020-11-01 ENCOUNTER — Encounter: Payer: Self-pay | Admitting: Internal Medicine

## 2020-11-01 MED ORDER — LOSARTAN POTASSIUM-HCTZ 50-12.5 MG PO TABS: 1.0000 | ORAL_TABLET | Freq: Every day | ORAL | 0 refills | Status: DC

## 2020-11-06 ENCOUNTER — Other Ambulatory Visit: Payer: Self-pay | Admitting: Internal Medicine

## 2020-12-18 ENCOUNTER — Other Ambulatory Visit: Payer: Self-pay | Admitting: Internal Medicine

## 2020-12-18 NOTE — Telephone Encounter (Signed)
Please refill as per office routine med refill policy (all routine meds to be refilled for 3 mo or monthly (per pt preference) up to one year from last visit, then month to month grace period for 3 mo, then further med refills will have to be denied) ? ?

## 2021-01-02 ENCOUNTER — Encounter: Payer: Self-pay | Admitting: Internal Medicine

## 2021-01-10 ENCOUNTER — Other Ambulatory Visit: Payer: Self-pay

## 2021-01-10 ENCOUNTER — Ambulatory Visit (INDEPENDENT_AMBULATORY_CARE_PROVIDER_SITE_OTHER): Payer: 59

## 2021-01-10 DIAGNOSIS — Z23 Encounter for immunization: Secondary | ICD-10-CM | POA: Diagnosis not present

## 2021-01-10 NOTE — Progress Notes (Signed)
Pt was given 2nd shingles vacc w/o any complications.  Pt given reg flu vacc w/o any complications.

## 2021-01-15 ENCOUNTER — Ambulatory Visit: Payer: 59

## 2021-01-22 ENCOUNTER — Other Ambulatory Visit: Payer: Self-pay | Admitting: Internal Medicine

## 2021-03-01 ENCOUNTER — Encounter: Payer: Self-pay | Admitting: Internal Medicine

## 2021-03-15 ENCOUNTER — Other Ambulatory Visit: Payer: Self-pay

## 2021-03-15 ENCOUNTER — Encounter: Payer: Self-pay | Admitting: Podiatry

## 2021-03-15 ENCOUNTER — Ambulatory Visit: Payer: 59

## 2021-03-15 ENCOUNTER — Ambulatory Visit: Payer: 59 | Admitting: Podiatry

## 2021-03-15 DIAGNOSIS — M778 Other enthesopathies, not elsewhere classified: Secondary | ICD-10-CM | POA: Diagnosis not present

## 2021-03-15 NOTE — Progress Notes (Signed)
Subjective:  Patient ID: [name] [name], female    DOB: Mar 15, 1960,  MRN: [id number] HPI Chief Complaint  Patient presents with   Foot Pain    Lateral side and plantar bilateral - burning sensations since July 2022, Orthopedic evaluated-xrayed, no findings, Rx'd meloxicam-no help, burning has continued   New Patient (Initial Visit)    61 y.o. female presents with the above complaint.   ROS: Denies fever chills nausea vomiting muscle aches pains calf pain back to chest pain shortness of breath.  Past Medical History:  Diagnosis Date   ANEMIA-NOS 12/08/2006   BACK PAIN 12/08/2006   BREAST HYPERTROPHY 02/05/2008   DIVERTICULOSIS, COLON 04/18/2010   GERD 12/08/2006   Heart murmur    HYPERLIPIDEMIA 12/08/2006   HYPERTENSION 12/08/2006   HYPOTHYROIDISM 02/05/2008   LIPOMA 04/18/2010   Mild mitral regurgitation by prior echocardiogram 02/07/2011   OVERWEIGHT/OBESITY 01/11/2010   Shortness of breath 01/12/2009   SHOULDER PAIN, RIGHT 03/09/2007   URI 11/23/2009   URINALYSIS, ABNORMAL 11/23/2009   Past Surgical History:  Procedure Laterality Date   BREAST REDUCTION SURGERY  2012   CESAREAN SECTION     COLONOSCOPY     TONSILLECTOMY AND ADENOIDECTOMY      Current Outpatient Medications:    aspirin 81 MG EC tablet, Take 1 tablet (81 mg total) by mouth daily. Swallow whole., Disp: 30 tablet, Rfl: 12   hydrocortisone 2.5 % cream, Apply topically 2 (two) times daily., Disp: , Rfl:    hydroquinone 4 % cream, Apply topically at bedtime., Disp: , Rfl:    levothyroxine (SYNTHROID) 50 MCG tablet, TAKE 1 TABLET BY MOUTH EVERY DAY, Disp: 90 tablet, Rfl: 2   losartan-hydrochlorothiazide (HYZAAR) 50-12.5 MG tablet, Take 1 tablet by mouth daily., Disp: 90 tablet, Rfl: 0   meloxicam (MOBIC) 7.5 MG tablet, Take by mouth., Disp: , Rfl:    pimecrolimus (ELIDEL) 1 % cream, Apply topically daily., Disp: , Rfl:    potassium chloride (KLOR-CON M10) 10 MEQ tablet, Take 1 tablet (10 mEq total) by mouth  daily., Disp: 90 tablet, Rfl: 2   rosuvastatin (CRESTOR) 10 MG tablet, TAKE 1 TABLET BY MOUTH EVERY DAY, Disp: 90 tablet, Rfl: 2   triamcinolone (NASACORT) 55 MCG/ACT AERO nasal inhaler, INSTILL 2 SPRAYS INTO NOSTRIL DAILY (Patient taking differently: PRN), Disp: 1 Inhaler, Rfl: 13   triamcinolone cream (KENALOG) 0.1 %, Apply topically., Disp: , Rfl:    Vitamin D, Ergocalciferol, (DRISDOL) 1.25 MG (50000 UNIT) CAPS capsule, Take 50,000 Units by mouth once a week., Disp: , Rfl:   Allergies  Allergen Reactions   Propoxyphene Hcl     *DARVOCET*    REACTION: nausea   Review of Systems Objective:  There were no vitals filed for this visit.  General: Well developed, nourished, in no acute distress, alert and oriented x3   Dermatological: Skin is warm, dry and supple bilateral. Nails x 10 are well maintained; remaining integument appears unremarkable at this time. There are no open sores, no preulcerative lesions, no rash or signs of infection present.  Vascular: Dorsalis Pedis artery and Posterior Tibial artery pedal pulses are 2/4 bilateral with immedate capillary fill time. Pedal hair growth present. No varicosities and no lower extremity edema present bilateral.   Neruologic: Grossly intact via light touch bilateral. Vibratory intact via tuning fork bilateral. Protective threshold with Semmes Wienstein monofilament intact to all pedal sites bilateral. Patellar and Achilles deep tendon reflexes 2+ bilateral. No Babinski or clonus noted bilateral.   Musculoskeletal: No gross boney  pedal deformities bilateral. No pain, crepitus, or limitation noted with foot and ankle range of motion bilateral. Muscular strength 5/5 in all groups tested bilateral.  No reproducible pain or symptoms.  Gait: Unassisted, Nonantalgic.    Radiographs:  None taken.  She had some taken at orthopedic office and does not want to have another set made.  Assessment & Plan:   Assessment: Ill fitting shoe gear most  likely resulting in lateral pain.  Plan: Purchase new pair shoes follow-up with me if this fails to alleviate her symptoms.     Savanna Dooley T. [place], [place]

## 2021-05-10 ENCOUNTER — Other Ambulatory Visit: Payer: Self-pay | Admitting: Internal Medicine

## 2021-05-10 NOTE — Telephone Encounter (Signed)
Please refill as per office routine med refill policy (all routine meds to be refilled for 3 mo or monthly (per pt preference) up to one year from last visit, then month to month grace period for 3 mo, then further med refills will have to be denied) ? ?

## 2021-05-23 ENCOUNTER — Encounter: Payer: Self-pay | Admitting: Internal Medicine

## 2021-05-23 MED ORDER — TRIAMCINOLONE ACETONIDE 55 MCG/ACT NA AERO: 2.0000 | INHALATION_SPRAY | Freq: Every day | NASAL | 3 refills | Status: AC

## 2021-06-02 ENCOUNTER — Other Ambulatory Visit: Payer: Self-pay | Admitting: Internal Medicine

## 2021-06-02 NOTE — Telephone Encounter (Signed)
Please refill as per office routine med refill policy (all routine meds to be refilled for 3 mo or monthly (per pt preference) up to one year from last visit, then month to month grace period for 3 mo, then further med refills will have to be denied) ? ?

## 2021-06-14 ENCOUNTER — Encounter: Payer: Self-pay | Admitting: Internal Medicine

## 2021-06-22 ENCOUNTER — Ambulatory Visit: Payer: 59 | Admitting: Internal Medicine

## 2021-06-22 ENCOUNTER — Encounter: Payer: Self-pay | Admitting: Internal Medicine

## 2021-06-22 VITALS — BP 114/68 | HR 73 | Temp 98.0°F | Ht 62.0 in | Wt 193.0 lb

## 2021-06-22 DIAGNOSIS — I10 Essential (primary) hypertension: Secondary | ICD-10-CM

## 2021-06-22 DIAGNOSIS — E559 Vitamin D deficiency, unspecified: Secondary | ICD-10-CM

## 2021-06-22 DIAGNOSIS — G5622 Lesion of ulnar nerve, left upper limb: Secondary | ICD-10-CM | POA: Diagnosis not present

## 2021-06-22 DIAGNOSIS — E7800 Pure hypercholesterolemia, unspecified: Secondary | ICD-10-CM | POA: Diagnosis not present

## 2021-06-22 DIAGNOSIS — R739 Hyperglycemia, unspecified: Secondary | ICD-10-CM

## 2021-06-22 DIAGNOSIS — Z6835 Body mass index (BMI) 35.0-35.9, adult: Secondary | ICD-10-CM

## 2021-06-22 DIAGNOSIS — E6609 Other obesity due to excess calories: Secondary | ICD-10-CM | POA: Diagnosis not present

## 2021-06-22 DIAGNOSIS — Z0001 Encounter for general adult medical examination with abnormal findings: Secondary | ICD-10-CM | POA: Diagnosis not present

## 2021-06-22 LAB — URINALYSIS, ROUTINE W REFLEX MICROSCOPIC
Bilirubin Urine: NEGATIVE
Ketones, ur: NEGATIVE
Nitrite: NEGATIVE
Specific Gravity, Urine: 1.025 (ref 1.000–1.030)
Total Protein, Urine: NEGATIVE
Urine Glucose: NEGATIVE
Urobilinogen, UA: 0.2 (ref 0.0–1.0)
pH: 6 (ref 5.0–8.0)

## 2021-06-22 LAB — HEPATIC FUNCTION PANEL
ALT: 16 U/L (ref 0–35)
AST: 16 U/L (ref 0–37)
Albumin: 4.1 g/dL (ref 3.5–5.2)
Alkaline Phosphatase: 100 U/L (ref 39–117)
Bilirubin, Direct: 0.1 mg/dL (ref 0.0–0.3)
Total Bilirubin: 0.3 mg/dL (ref 0.2–1.2)
Total Protein: 6.9 g/dL (ref 6.0–8.3)

## 2021-06-22 LAB — CBC WITH DIFFERENTIAL/PLATELET
Basophils Absolute: 0 10*3/uL (ref 0.0–0.1)
Basophils Relative: 0.4 % (ref 0.0–3.0)
Eosinophils Absolute: 0.2 10*3/uL (ref 0.0–0.7)
Eosinophils Relative: 2.5 % (ref 0.0–5.0)
HCT: 41.5 % (ref 36.0–46.0)
Hemoglobin: 13.7 g/dL (ref 12.0–15.0)
Lymphocytes Relative: 40 % (ref 12.0–46.0)
Lymphs Abs: 3 10*3/uL (ref 0.7–4.0)
MCHC: 33.1 g/dL (ref 30.0–36.0)
MCV: 90.2 fl (ref 78.0–100.0)
Monocytes Absolute: 0.6 10*3/uL (ref 0.1–1.0)
Monocytes Relative: 7.4 % (ref 3.0–12.0)
Neutro Abs: 3.8 10*3/uL (ref 1.4–7.7)
Neutrophils Relative %: 49.7 % (ref 43.0–77.0)
Platelets: 330 10*3/uL (ref 150.0–400.0)
RBC: 4.61 Mil/uL (ref 3.87–5.11)
RDW: 14.1 % (ref 11.5–15.5)
WBC: 7.6 10*3/uL (ref 4.0–10.5)

## 2021-06-22 LAB — BASIC METABOLIC PANEL
BUN: 23 mg/dL (ref 6–23)
CO2: 29 mEq/L (ref 19–32)
Calcium: 9.1 mg/dL (ref 8.4–10.5)
Chloride: 105 mEq/L (ref 96–112)
Creatinine, Ser: 0.75 mg/dL (ref 0.40–1.20)
GFR: 85.1 mL/min (ref >60.00)
Glucose, Bld: 86 mg/dL (ref 70–99)
Potassium: 3.5 mEq/L (ref 3.5–5.1)
Sodium: 141 mEq/L (ref 135–145)

## 2021-06-22 LAB — LIPID PANEL
Cholesterol: 166 mg/dL (ref 0–200)
HDL: 56.5 mg/dL (ref >39.00)
LDL Cholesterol: 82 mg/dL (ref 0–99)
NonHDL: 109.79
Total CHOL/HDL Ratio: 3
Triglycerides: 138 mg/dL (ref 0.0–149.0)
VLDL: 27.6 mg/dL (ref 0.0–40.0)

## 2021-06-22 LAB — VITAMIN D 25 HYDROXY (VIT D DEFICIENCY, FRACTURES): VITD: 35.59 ng/mL (ref 30.00–100.00)

## 2021-06-22 LAB — TSH: TSH: 2.92 u[IU]/mL (ref 0.35–5.50)

## 2021-06-22 LAB — HEMOGLOBIN A1C: Hgb A1c MFr Bld: 5.8 % (ref 4.6–6.5)

## 2021-06-22 MED ORDER — MELOXICAM 7.5 MG PO TABS: 7.5000 mg | ORAL_TABLET | Freq: Every day | ORAL | 2 refills | Status: DC

## 2021-06-22 NOTE — Progress Notes (Signed)
Patient ID: [name] [name], female   DOB: 09/17/1960, 61 y.o.   MRN: [id number] ? ? ? ?     Chief Complaint:: wellness exam and Office Visit (Left shoulder/arm pain) ? , hyperglycemia, obesity, low vit d, hld, htn ? ?     HPI:  [name] [name] is a 61 y.o. female here for wellness exam; declines covid booster, pap smear for now, o/w up to date ?         ?              Also c/o recent onset 2 wks left arm pain, seemed to start above the elbow to the shoulder, but later also below the elbow to the left hand associated with intermittent left grip and hand weakness, mild to mod, intermittent, not bad today at all but has been worse to lie on the left side, and reminds her of an episode of left shoulder bursitis but has not direct pain to the shoulder itself, has FROM, no recent trauma or heavy lifting,.  Pain overall better with some mobic she had leftover fro a year or so ago.  Pt denies chest pain, increased sob or doe, wheezing, orthopnea, PND, increased LE swelling, palpitations, dizziness or syncope.   Pt denies polydipsia, polyuria, or new focal neuro s/s.   Lost wt with better diet.  Has been trying to more wt but unable so far.     ?Wt Readings from Last 3 Encounters:  ?06/22/21 193 lb (87.5 kg)  ?08/09/20 198 lb (89.8 kg)  ?10/18/19 205 lb (93 kg)  ? ?BP Readings from Last 3 Encounters:  ?06/22/21 114/68  ?08/09/20 (!) 150/98  ?10/18/19 (!) 111/53  ? ?Immunization History  ?Administered Date(s) Administered  ? Influenza Split 01/29/2011, 02/26/2012  ? Influenza Whole 12/29/2007, 11/25/2008  ? Influenza,inj,Quad PF,6+ Mos 12/08/2013, 12/15/2014, 01/09/2018, 12/08/2018, 01/10/2021  ? Influenza-Unspecified 11/25/2016  ? Moderna Sars-Covid-2 Vaccination 04/29/2019, 06/01/2019, 01/07/2020  ? Td 02/05/2008  ? Tdap 12/08/2018  ? Zoster Recombinat (Shingrix) 08/09/2020, 01/10/2021  ?There are no preventive care reminders to display for this patient. ?  ? ?Past Medical History:  ?Diagnosis Date  ? ANEMIA-NOS 12/08/2006   ? BACK PAIN 12/08/2006  ? BREAST HYPERTROPHY 02/05/2008  ? DIVERTICULOSIS, COLON 04/18/2010  ? GERD 12/08/2006  ? Heart murmur   ? HYPERLIPIDEMIA 12/08/2006  ? HYPERTENSION 12/08/2006  ? HYPOTHYROIDISM 02/05/2008  ? LIPOMA 04/18/2010  ? Mild mitral regurgitation by prior echocardiogram 02/07/2011  ? OVERWEIGHT/OBESITY 01/11/2010  ? Shortness of breath 01/12/2009  ? SHOULDER PAIN, RIGHT 03/09/2007  ? URI 11/23/2009  ? URINALYSIS, ABNORMAL 11/23/2009  ? ?Past Surgical History:  ?Procedure Laterality Date  ? BREAST REDUCTION SURGERY  2012  ? CESAREAN SECTION    ? COLONOSCOPY    ? TONSILLECTOMY AND ADENOIDECTOMY    ? ? reports that she has never smoked. She has never used smokeless tobacco. She reports current alcohol use. She reports that she does not use drugs. ?family history includes Cancer in her cousin and another family member; Diabetes in her mother; Hypertension in her brother, mother, and sister; Stroke (age of onset: 76) in her mother. ?Allergies  ?Allergen Reactions  ? Propoxyphene Hcl   ?  *DARVOCET*    REACTION: nausea  ? ?Current Outpatient Medications on File Prior to Visit  ?Medication Sig Dispense Refill  ? aspirin 81 MG EC tablet Take 1 tablet (81 mg total) by mouth daily. Swallow whole. 30 tablet 12  ? hydrocortisone 2.5 % cream Apply  topically 2 (two) times daily.    ? hydroquinone 4 % cream Apply topically at bedtime.    ? levothyroxine (SYNTHROID) 50 MCG tablet TAKE 1 TABLET BY MOUTH EVERY DAY 90 tablet 2  ? losartan-hydrochlorothiazide (HYZAAR) 50-12.5 MG tablet Take 1 tablet by mouth daily. 90 tablet 0  ? pimecrolimus (ELIDEL) 1 % cream Apply topically daily.    ? potassium chloride (KLOR-CON M10) 10 MEQ tablet TAKE 1 TABLET BY MOUTH EVERY DAY 90 tablet 0  ? rosuvastatin (CRESTOR) 10 MG tablet TAKE 1 TABLET BY MOUTH EVERY DAY 90 tablet 2  ? triamcinolone (NASACORT) 55 MCG/ACT AERO nasal inhaler Place 2 sprays into the nose daily. INSTILL 2 SPRAYS INTO NOSTRIL DAILY Strength: 55 MCG/ACT 1 each 3  ?  triamcinolone cream (KENALOG) 0.1 % Apply topically.    ? Vitamin D, Ergocalciferol, (DRISDOL) 1.25 MG (50000 UNIT) CAPS capsule Take 50,000 Units by mouth once a week.    ? ?No current facility-administered medications on file prior to visit.  ? ?     ROS:  All others reviewed and negative. ? ?Objective  ? ?     PE:  BP 114/68 (BP Location: Right Arm, Patient Position: Sitting, Cuff Size: Large)   Pulse 73   Temp 98 ?F (36.7 ?C) (Oral)   Ht '5\' 2"'$  (1.575 m)   Wt 193 lb (87.5 kg)   LMP  (LMP Unknown)   SpO2 98%   BMI 35.30 kg/m?  ? ?              Constitutional: Pt appears in NAD ?              HENT: Head: NCAT.  ?              Right Ear: External ear normal.   ?              Left Ear: External ear normal.  ?              Eyes: . Pupils are equal, round, and reactive to light. Conjunctivae and EOM are normal ?              Nose: without d/c or deformity ?              Neck: Neck supple. Gross normal ROM ?              Cardiovascular: Normal rate and regular rhythm.   ?              Pulmonary/Chest: Effort normal and breath sounds without rales or wheezing.  ?              Abd:  Soft, NT, ND, + BS, no organomegaly ?              Neurological: Pt is alert. At baseline orientation, motor grossly intact, cn 2-12 intact; left shoudler NT with FROM ?              Skin: Skin is warm. No rashes, no other new lesions, LE edema - none ?              Psychiatric: Pt behavior is normal without agitation  ? ?Micro: none ? ?Cardiac tracings I have personally interpreted today:  none ? ?Pertinent Radiological findings (summarize): none  ? ?Lab Results  ?Component Value Date  ? WBC 7.6 06/22/2021  ? HGB 13.7 06/22/2021  ? HCT 41.5 06/22/2021  ? PLT 330.0 06/22/2021  ?  GLUCOSE 86 06/22/2021  ? CHOL 166 06/22/2021  ? TRIG 138.0 06/22/2021  ? HDL 56.50 06/22/2021  ? [place] 82 06/22/2021  ? ALT 16 06/22/2021  ? AST 16 06/22/2021  ? NA 141 06/22/2021  ? K 3.5 06/22/2021  ? CL 105 06/22/2021  ? CREATININE 0.75 06/22/2021  ? BUN 23  06/22/2021  ? CO2 29 06/22/2021  ? TSH 2.92 06/22/2021  ? HGBA1C 5.8 06/22/2021  ? MICROALBUR 10.0 Repeated and verified X2. (H) 12/18/2015  ? ?Assessment/Plan:  ?[name] [name] is a 61 y.o. Black or African American [2] female with  has a past medical history of ANEMIA-NOS (12/08/2006), BACK PAIN (12/08/2006), BREAST HYPERTROPHY (02/05/2008), DIVERTICULOSIS, COLON (04/18/2010), GERD (12/08/2006), Heart murmur, HYPERLIPIDEMIA (12/08/2006), HYPERTENSION (12/08/2006), HYPOTHYROIDISM (02/05/2008), LIPOMA (04/18/2010), Mild mitral regurgitation by prior echocardiogram (02/07/2011), OVERWEIGHT/OBESITY (01/11/2010), Shortness of breath (01/12/2009), SHOULDER PAIN, RIGHT (03/09/2007), URI (11/23/2009), and URINALYSIS, ABNORMAL (11/23/2009). ? ?Encounter for well adult exam with abnormal findings ?Age and sex appropriate education and counseling updated with regular exercise and diet ?Referrals for preventative services - none needed ?Immunizations addressed - declines covid booster, pap smear ?Smoking counseling  - none needed ?Evidence for depression or other mood disorder - none significant ?Most recent labs reviewed. ?I have personally reviewed and have noted: ?1) the patient's medical and social history ?2) The patient's current medications and supplements ?3) The patient's height, weight, and BMI have been recorded in the chart ? ? ?Hyperlipidemia ?Lab Results  ?Component Value Date  ? [place] 82 06/22/2021  ? ?Uncontrolled, goal ldl < 70, pt to continue current statin crestor 10, declines change for now ? ? ?Essential hypertension ?BP Readings from Last 3 Encounters:  ?06/22/21 114/68  ?08/09/20 (!) 150/98  ?10/18/19 (!) 111/53  ? ?Stable, pt to continue medical treatment hyzaar ? ? ?Hyperglycemia ?Lab Results  ?Component Value Date  ? HGBA1C 5.8 06/22/2021  ? ?Stable, pt to continue current medical treatment  - diet ? ? ?Vitamin D deficiency ?Last vitamin D ?Lab Results  ?Component Value Date  ? VD25OH 35.59 06/22/2021   ? ?Low to start oral replacement ? ? ?Neuritis of left ulnar nerve ?High suspicion for left ulnar nerve involvement, for neuro referral and possible emg/ncs, mobic prn to continue ? ?Obesity ?D/w pt  - de

## 2021-06-22 NOTE — Patient Instructions (Signed)
Please take all new medication as prescribed - the meloxicam as needed ? ?You will be contacted regarding the referral for: neurology  ? ?Please let us know if you would want the [facility] if covered by your insurance ? ?Please continue all other medications as before, and refills have been done if requested. ? ?Please have the pharmacy call with any other refills you may need. ? ?Please continue your efforts at being more active, low cholesterol diet, and weight control. ? ?You are otherwise up to date with prevention measures today. ? ?Please keep your appointments with your specialists as you may have planned ? ?Please go to the LAB at the blood drawing area for the tests to be done ? ?You will be contacted by phone if any changes need to be made immediately.  Otherwise, you will receive a letter about your results with an explanation, but please check with MyChart first. ? ?Please remember to sign up for MyChart if you have not done so, as this will be important to you in the future with finding out test results, communicating by private email, and scheduling acute appointments online when needed. ? ? ? ? ? ? ?

## 2021-06-23 ENCOUNTER — Encounter: Payer: Self-pay | Admitting: Internal Medicine

## 2021-06-23 NOTE — Assessment & Plan Note (Signed)
Age and sex appropriate education and counseling updated with regular exercise and diet ?Referrals for preventative services - none needed ?Immunizations addressed - declines covid booster, pap smear ?Smoking counseling  - none needed ?Evidence for depression or other mood disorder - none significant ?Most recent labs reviewed. ?I have personally reviewed and have noted: ?1) the patient's medical and social history ?2) The patient's current medications and supplements ?3) The patient's height, weight, and BMI have been recorded in the chart ? ?

## 2021-06-23 NOTE — Assessment & Plan Note (Signed)
Lab Results  ?Component Value Date  ? [place] 82 06/22/2021  ? ?Uncontrolled, goal ldl < 70, pt to continue current statin crestor 10, declines change for now ? ?

## 2021-06-23 NOTE — Assessment & Plan Note (Signed)
Last vitamin D ?Lab Results  ?Component Value Date  ? VD25OH 35.59 06/22/2021  ? ?Low to start oral replacement ? ?

## 2021-06-23 NOTE — Assessment & Plan Note (Signed)
BP Readings from Last 3 Encounters:  ?06/22/21 114/68  ?08/09/20 (!) 150/98  ?10/18/19 (!) 111/53  ? ?Stable, pt to continue medical treatment hyzaar ? ?

## 2021-06-23 NOTE — Assessment & Plan Note (Signed)
High suspicion for left ulnar nerve involvement, for neuro referral and possible emg/ncs, mobic prn to continue ?

## 2021-06-23 NOTE — Assessment & Plan Note (Signed)
D/w pt  - declines ozempic for now ?

## 2021-06-23 NOTE — Assessment & Plan Note (Signed)
Lab Results  ?Component Value Date  ? HGBA1C 5.8 06/22/2021  ? ?Stable, pt to continue current medical treatment  - diet ? ?

## 2021-07-04 ENCOUNTER — Encounter: Payer: Self-pay | Admitting: Internal Medicine

## 2021-07-11 ENCOUNTER — Telehealth: Payer: Self-pay | Admitting: *Deleted

## 2021-07-11 NOTE — Telephone Encounter (Signed)
No sorry, the neurology are the ones to do the nerve testing ?

## 2021-07-11 NOTE — Telephone Encounter (Signed)
Noted... Sent pt message leeting her know status.[name] KitchenJohny Chess ?

## 2021-07-11 NOTE — Telephone Encounter (Signed)
-----   Message from Carlynn Purl sent at 07/11/2021  8:09 AM EDT ----- ?Regarding: RE: referral ?[facility] no longer does EMG/NCS testing. Sent to headache wellness. They will contact pt ?----- Message ----- ?From: Earnstine Regal, RMA ?Sent: 07/04/2021   2:35 PM EDT ?To: Clearnce Sorrel ?Subject: referral                                      ? ?Hey ladies.Marland Kitchen this pt is requiring abt her nerve test. According to her chart Dr. Jenny Reichmann placed on 06/22/21 and Mrs. Bonnita Nasuti was doing it.. there is a note in there. I'm not sure if she fought another place, but can you inform pt appt nerve referral ? ? ?

## 2021-07-12 ENCOUNTER — Encounter: Payer: Self-pay | Admitting: Internal Medicine

## 2021-07-12 MED ORDER — LOSARTAN POTASSIUM-HCTZ 50-12.5 MG PO TABS: 1.0000 | ORAL_TABLET | Freq: Every day | ORAL | 0 refills | Status: DC

## 2021-07-13 MED ORDER — LOSARTAN POTASSIUM-HCTZ 50-12.5 MG PO TABS: 1.0000 | ORAL_TABLET | Freq: Every day | ORAL | 0 refills | Status: DC

## 2021-07-13 NOTE — Addendum Note (Signed)
Addended by: Terence Lux A on: 07/13/2021 01:57 PM   Modules accepted: Orders

## 2021-07-13 NOTE — Addendum Note (Signed)
Addended by: Terence Lux A on: 07/13/2021 01:58 PM   Modules accepted: Orders

## 2021-08-17 ENCOUNTER — Encounter: Payer: 59 | Admitting: Internal Medicine

## 2021-08-22 ENCOUNTER — Encounter: Payer: 59 | Admitting: Internal Medicine

## 2021-09-06 ENCOUNTER — Other Ambulatory Visit: Payer: Self-pay | Admitting: Internal Medicine

## 2021-09-06 NOTE — Telephone Encounter (Signed)
Please refill as per office routine med refill policy (all routine meds to be refilled for 3 mo or monthly (per pt preference) up to one year from last visit, then month to month grace period for 3 mo, then further med refills will have to be denied) ? ?

## 2021-09-21 ENCOUNTER — Ambulatory Visit (INDEPENDENT_AMBULATORY_CARE_PROVIDER_SITE_OTHER): Payer: 59 | Admitting: Internal Medicine

## 2021-09-21 ENCOUNTER — Encounter: Payer: Self-pay | Admitting: Internal Medicine

## 2021-09-21 VITALS — BP 116/70 | HR 83 | Temp 98.4°F | Ht 62.0 in | Wt 197.0 lb

## 2021-09-21 DIAGNOSIS — R7989 Other specified abnormal findings of blood chemistry: Secondary | ICD-10-CM | POA: Insufficient documentation

## 2021-09-21 DIAGNOSIS — E559 Vitamin D deficiency, unspecified: Secondary | ICD-10-CM | POA: Diagnosis not present

## 2021-09-21 DIAGNOSIS — Z0001 Encounter for general adult medical examination with abnormal findings: Secondary | ICD-10-CM

## 2021-09-21 DIAGNOSIS — E7800 Pure hypercholesterolemia, unspecified: Secondary | ICD-10-CM

## 2021-09-21 DIAGNOSIS — I10 Essential (primary) hypertension: Secondary | ICD-10-CM

## 2021-09-21 DIAGNOSIS — R6882 Decreased libido: Secondary | ICD-10-CM | POA: Insufficient documentation

## 2021-09-21 DIAGNOSIS — R739 Hyperglycemia, unspecified: Secondary | ICD-10-CM | POA: Diagnosis not present

## 2021-09-21 DIAGNOSIS — R635 Abnormal weight gain: Secondary | ICD-10-CM | POA: Insufficient documentation

## 2021-09-21 DIAGNOSIS — Z780 Asymptomatic menopausal state: Secondary | ICD-10-CM | POA: Insufficient documentation

## 2021-09-21 DIAGNOSIS — R768 Other specified abnormal immunological findings in serum: Secondary | ICD-10-CM | POA: Insufficient documentation

## 2021-09-21 MED ORDER — POTASSIUM CHLORIDE CRYS ER 10 MEQ PO TBCR: 10.0000 meq | EXTENDED_RELEASE_TABLET | Freq: Every day | ORAL | 3 refills | Status: DC

## 2021-09-21 MED ORDER — LOSARTAN POTASSIUM-HCTZ 50-12.5 MG PO TABS: 1.0000 | ORAL_TABLET | Freq: Every day | ORAL | 3 refills | Status: DC

## 2021-09-21 MED ORDER — ROSUVASTATIN CALCIUM 10 MG PO TABS: 10.0000 mg | ORAL_TABLET | Freq: Every day | ORAL | 3 refills | Status: DC

## 2021-09-21 MED ORDER — LEVOTHYROXINE SODIUM 50 MCG PO TABS: 50.0000 ug | ORAL_TABLET | Freq: Every day | ORAL | 3 refills | Status: DC

## 2021-09-21 NOTE — Assessment & Plan Note (Signed)
Last vitamin D Lab Results  Component Value Date   VD25OH 35.59 06/22/2021   Low, to start oral replacement

## 2021-09-21 NOTE — Patient Instructions (Signed)
Ok to cancel the nerve test for the arm  Please continue all other medications as before, and refills have been done if requested.  Please have the pharmacy call with any other refills you may need.  Please continue your efforts at being more active, low cholesterol diet, and weight control.  You are otherwise up to date with prevention measures today.  Please keep your appointments with your specialists as you may have planned  Please go to the LAB at the blood drawing area for the tests to be done - at the [facility] lab next wk at your convenience  You will be contacted by phone if any changes need to be made immediately.  Otherwise, you will receive a letter about your results with an explanation, but please check with MyChart first.  Please remember to sign up for MyChart if you have not done so, as this will be important to you in the future with finding out test results, communicating by private email, and scheduling acute appointments online when needed.  Please make an Appointment to return in 6 months, or sooner if needed, also with Lab Appointment for testing done 3-5 days before at the [facility] (so this is for TWO appointments - please see the scheduling desk as you leave)

## 2021-09-21 NOTE — Progress Notes (Unsigned)
Patient ID: [name] [name], female   DOB: 04/21/1960, 61 y.o.   MRN: [id number]         Chief Complaint:: wellness exam and low vit d, hld, hyperglycemia, htn       HPI:  [name] [name] is a 61 y.o. female here for wellness exam; pt will call for gyn pap soon, o/w up to date                        Also Pt denies chest pain, increased sob or doe, wheezing, orthopnea, PND, increased LE swelling, palpitations, dizziness or syncope.   Pt denies polydipsia, polyuria, or new focal neuro s/s.    Pt denies fever, wt loss, night sweats, loss of appetite, or other constitutional symptoms     Wt Readings from Last 3 Encounters:  09/21/21 197 lb (89.4 kg)  06/22/21 193 lb (87.5 kg)  08/09/20 198 lb (89.8 kg)   BP Readings from Last 3 Encounters:  09/21/21 116/70  06/22/21 114/68  08/09/20 (!) 150/98   Immunization History  Administered Date(s) Administered   Influenza Split 01/29/2011, 02/26/2012   Influenza Whole 12/29/2007, 11/25/2008   Influenza,inj,Quad PF,6+ Mos 12/08/2013, 12/15/2014, 01/09/2018, 12/08/2018, 01/10/2021   Influenza-Unspecified 11/25/2016   Moderna Sars-Covid-2 Vaccination 04/29/2019, 06/01/2019, 01/07/2020   Td 02/05/2008   Tdap 12/08/2018   Zoster Recombinat (Shingrix) 08/09/2020, 01/10/2021  There are no preventive care reminders to display for this patient.    Past Medical History:  Diagnosis Date   ANEMIA-NOS 12/08/2006   BACK PAIN 12/08/2006   BREAST HYPERTROPHY 02/05/2008   DIVERTICULOSIS, COLON 04/18/2010   GERD 12/08/2006   Heart murmur    HYPERLIPIDEMIA 12/08/2006   HYPERTENSION 12/08/2006   HYPOTHYROIDISM 02/05/2008   LIPOMA 04/18/2010   Mild mitral regurgitation by prior echocardiogram 02/07/2011   OVERWEIGHT/OBESITY 01/11/2010   Shortness of breath 01/12/2009   SHOULDER PAIN, RIGHT 03/09/2007   URI 11/23/2009   URINALYSIS, ABNORMAL 11/23/2009   Past Surgical History:  Procedure Laterality Date   BREAST REDUCTION SURGERY  2012   CESAREAN  SECTION     COLONOSCOPY     TONSILLECTOMY AND ADENOIDECTOMY      reports that she has never smoked. She has never used smokeless tobacco. She reports current alcohol use. She reports that she does not use drugs. family history includes Cancer in her cousin and another family member; Diabetes in her mother; Hypertension in her brother, mother, and sister; Stroke (age of onset: 37) in her mother. Allergies  Allergen Reactions   Propoxyphene Hcl     *DARVOCET*    REACTION: nausea   Current Outpatient Medications on File Prior to Visit  Medication Sig Dispense Refill   aspirin 81 MG EC tablet Take 1 tablet (81 mg total) by mouth daily. Swallow whole. 30 tablet 12   hydrocortisone 2.5 % cream Apply topically 2 (two) times daily.     hydroquinone 4 % cream Apply topically at bedtime.     meloxicam (MOBIC) 7.5 MG tablet Take 1 tablet (7.5 mg total) by mouth daily. 90 tablet 2   pimecrolimus (ELIDEL) 1 % cream Apply topically daily.     triamcinolone (NASACORT) 55 MCG/ACT AERO nasal inhaler Place 2 sprays into the nose daily. INSTILL 2 SPRAYS INTO NOSTRIL DAILY Strength: 55 MCG/ACT 1 each 3   triamcinolone cream (KENALOG) 0.1 % Apply topically.     Vitamin D, Ergocalciferol, (DRISDOL) 1.25 MG (50000 UNIT) CAPS capsule Take 50,000 Units by mouth once a  week.     No current facility-administered medications on file prior to visit.        ROS:  All others reviewed and negative.  Objective        PE:  BP 116/70 (BP Location: Right Arm, Patient Position: Sitting, Cuff Size: Large)   Pulse 83   Temp 98.4 F (36.9 C) (Oral)   Ht '5\' 2"'$  (1.575 m)   Wt 197 lb (89.4 kg)   LMP  (LMP Unknown)   SpO2 100%   BMI 36.03 kg/m                 Constitutional: Pt appears in NAD               HENT: Head: NCAT.                Right Ear: External ear normal.                 Left Ear: External ear normal.                Eyes: . Pupils are equal, round, and reactive to light. Conjunctivae and EOM are  normal               Nose: without d/c or deformity               Neck: Neck supple. Gross normal ROM               Cardiovascular: Normal rate and regular rhythm.                 Pulmonary/Chest: Effort normal and breath sounds without rales or wheezing.                Abd:  Soft, NT, ND, + BS, no organomegaly               Neurological: Pt is alert. At baseline orientation, motor grossly intact               Skin: Skin is warm. No rashes, no other new lesions, LE edema - none               Psychiatric: Pt behavior is normal without agitation   Micro: none  Cardiac tracings I have personally interpreted today:  none  Pertinent Radiological findings (summarize): none   Lab Results  Component Value Date   WBC 7.6 06/22/2021   HGB 13.7 06/22/2021   HCT 41.5 06/22/2021   PLT 330.0 06/22/2021   GLUCOSE 86 06/22/2021   CHOL 166 06/22/2021   TRIG 138.0 06/22/2021   HDL 56.50 06/22/2021   LDLCALC 82 06/22/2021   ALT 16 06/22/2021   AST 16 06/22/2021   NA 141 06/22/2021   K 3.5 06/22/2021   CL 105 06/22/2021   CREATININE 0.75 06/22/2021   BUN 23 06/22/2021   CO2 29 06/22/2021   TSH 2.92 06/22/2021   HGBA1C 5.8 06/22/2021   MICROALBUR 10.0 Repeated and verified X2. (H) 12/18/2015   Assessment/Plan:  [name] [name] is a 61 y.o. Black or African American [2] female with  has a past medical history of ANEMIA-NOS (12/08/2006), BACK PAIN (12/08/2006), BREAST HYPERTROPHY (02/05/2008), DIVERTICULOSIS, COLON (04/18/2010), GERD (12/08/2006), Heart murmur, HYPERLIPIDEMIA (12/08/2006), HYPERTENSION (12/08/2006), HYPOTHYROIDISM (02/05/2008), LIPOMA (04/18/2010), Mild mitral regurgitation by prior echocardiogram (02/07/2011), OVERWEIGHT/OBESITY (01/11/2010), Shortness of breath (01/12/2009), SHOULDER PAIN, RIGHT (03/09/2007), URI (11/23/2009), and URINALYSIS, ABNORMAL (11/23/2009).  Vitamin D deficiency Last vitamin D Lab Results  Component Value  Date   VD25OH 35.59 06/22/2021   Low, to start  oral replacement   Encounter for well adult exam with abnormal findings Age and sex appropriate education and counseling updated with regular exercise and diet Referrals for preventative services - pt to call for GYN appt soon Immunizations addressed - none needed Smoking counseling  - none needed Evidence for depression or other mood disorder - none significant Most recent labs reviewed. I have personally reviewed and have noted: 1) the patient's medical and social history 2) The patient's current medications and supplements 3) The patient's height, weight, and BMI have been recorded in the chart   Hyperlipidemia Lab Results  Component Value Date   [place] 82 06/22/2021   uncontrolled, pt to continue current statin crestor 10 mg qd as declines increase for now, but for f/u lipids next wk   Hyperglycemia Lab Results  Component Value Date   HGBA1C 5.8 06/22/2021   Stable, pt to continue current medical treatment  - diet, wt control, excercise   Essential hypertension BP Readings from Last 3 Encounters:  09/21/21 116/70  06/22/21 114/68  08/09/20 (!) 150/98   Stable, pt to continue medical treatment hyzaar 50 - 12.5 mg qd   Followup: Return in about 6 months (around 03/24/2022).  Cathlean Cower, MD 09/22/2021 6:33 PM [facility]

## 2021-09-22 ENCOUNTER — Encounter: Payer: Self-pay | Admitting: Internal Medicine

## 2021-09-22 NOTE — Assessment & Plan Note (Signed)
BP Readings from Last 3 Encounters:  09/21/21 116/70  06/22/21 114/68  08/09/20 (!) 150/98   Stable, pt to continue medical treatment hyzaar 50 - 12.5 mg qd

## 2021-09-22 NOTE — Assessment & Plan Note (Signed)
Age and sex appropriate education and counseling updated with regular exercise and diet Referrals for preventative services - pt to call for GYN appt soon Immunizations addressed - none needed Smoking counseling  - none needed Evidence for depression or other mood disorder - none significant Most recent labs reviewed. I have personally reviewed and have noted: 1) the patient's medical and social history 2) The patient's current medications and supplements 3) The patient's height, weight, and BMI have been recorded in the chart

## 2021-09-22 NOTE — Assessment & Plan Note (Addendum)
Lab Results  Component Value Date   LDLCALC 82 06/22/2021   uncontrolled, pt to continue current statin crestor 10 mg qd as declines increase for now, but for f/u lipids next wk

## 2021-09-22 NOTE — Assessment & Plan Note (Signed)
Lab Results  Component Value Date   HGBA1C 5.8 06/22/2021   Stable, pt to continue current medical treatment  - diet, wt control, excercise

## 2021-09-24 ENCOUNTER — Other Ambulatory Visit (INDEPENDENT_AMBULATORY_CARE_PROVIDER_SITE_OTHER): Payer: 59

## 2021-09-24 DIAGNOSIS — E7800 Pure hypercholesterolemia, unspecified: Secondary | ICD-10-CM | POA: Diagnosis not present

## 2021-09-24 DIAGNOSIS — E559 Vitamin D deficiency, unspecified: Secondary | ICD-10-CM | POA: Diagnosis not present

## 2021-09-24 DIAGNOSIS — R739 Hyperglycemia, unspecified: Secondary | ICD-10-CM

## 2021-09-24 LAB — URINALYSIS, ROUTINE W REFLEX MICROSCOPIC
Ketones, ur: NEGATIVE
Nitrite: NEGATIVE
Specific Gravity, Urine: 1.025 (ref 1.000–1.030)
Total Protein, Urine: 100 — AB
Urine Glucose: NEGATIVE
Urobilinogen, UA: 0.2 (ref 0.0–1.0)
pH: 6.5 (ref 5.0–8.0)

## 2021-09-24 LAB — TSH: TSH: 4.67 u[IU]/mL (ref 0.35–5.50)

## 2021-09-24 LAB — LIPID PANEL
Cholesterol: 153 mg/dL (ref 0–200)
HDL: 56.9 mg/dL (ref >39.00)
LDL Cholesterol: 79 mg/dL (ref 0–99)
NonHDL: 96.02
Total CHOL/HDL Ratio: 3
Triglycerides: 87 mg/dL (ref 0.0–149.0)
VLDL: 17.4 mg/dL (ref 0.0–40.0)

## 2021-09-24 LAB — CBC WITH DIFFERENTIAL/PLATELET
Basophils Absolute: 0 10*3/uL (ref 0.0–0.1)
Basophils Relative: 0.4 % (ref 0.0–3.0)
Eosinophils Absolute: 0.2 10*3/uL (ref 0.0–0.7)
Eosinophils Relative: 4 % (ref 0.0–5.0)
HCT: 38.7 % (ref 36.0–46.0)
Hemoglobin: 13 g/dL (ref 12.0–15.0)
Lymphocytes Relative: 43.5 % (ref 12.0–46.0)
Lymphs Abs: 2.3 10*3/uL (ref 0.7–4.0)
MCHC: 33.8 g/dL (ref 30.0–36.0)
MCV: 90.4 fl (ref 78.0–100.0)
Monocytes Absolute: 0.3 10*3/uL (ref 0.1–1.0)
Monocytes Relative: 6.7 % (ref 3.0–12.0)
Neutro Abs: 2.4 10*3/uL (ref 1.4–7.7)
Neutrophils Relative %: 45.4 % (ref 43.0–77.0)
Platelets: 272 10*3/uL (ref 150.0–400.0)
RBC: 4.28 Mil/uL (ref 3.87–5.11)
RDW: 13.9 % (ref 11.5–15.5)
WBC: 5.2 10*3/uL (ref 4.0–10.5)

## 2021-09-24 LAB — HEPATIC FUNCTION PANEL
ALT: 15 U/L (ref 0–35)
AST: 14 U/L (ref 0–37)
Albumin: 3.8 g/dL (ref 3.5–5.2)
Alkaline Phosphatase: 100 U/L (ref 39–117)
Bilirubin, Direct: 0.1 mg/dL (ref 0.0–0.3)
Total Bilirubin: 0.4 mg/dL (ref 0.2–1.2)
Total Protein: 6.5 g/dL (ref 6.0–8.3)

## 2021-09-24 LAB — BASIC METABOLIC PANEL
BUN: 11 mg/dL (ref 6–23)
CO2: 28 mEq/L (ref 19–32)
Calcium: 8.5 mg/dL (ref 8.4–10.5)
Chloride: 108 mEq/L (ref 96–112)
Creatinine, Ser: 0.69 mg/dL (ref 0.40–1.20)
GFR: 92.6 mL/min (ref >60.00)
Glucose, Bld: 93 mg/dL (ref 70–99)
Potassium: 3.5 mEq/L (ref 3.5–5.1)
Sodium: 143 mEq/L (ref 135–145)

## 2021-09-24 LAB — MICROALBUMIN / CREATININE URINE RATIO
Creatinine,U: 315.1 mg/dL
Microalb Creat Ratio: 4.8 mg/g (ref 0.0–30.0)
Microalb, Ur: 15 mg/dL — ABNORMAL HIGH (ref 0.0–1.9)

## 2021-09-24 LAB — HEMOGLOBIN A1C: Hgb A1c MFr Bld: 6 % (ref 4.6–6.5)

## 2021-09-24 LAB — VITAMIN D 25 HYDROXY (VIT D DEFICIENCY, FRACTURES): VITD: 36.75 ng/mL (ref 30.00–100.00)

## 2021-09-25 ENCOUNTER — Encounter: Payer: Self-pay | Admitting: Internal Medicine

## 2021-09-25 ENCOUNTER — Other Ambulatory Visit: Payer: Self-pay | Admitting: Internal Medicine

## 2021-09-25 MED ORDER — ROSUVASTATIN CALCIUM 20 MG PO TABS: 20.0000 mg | ORAL_TABLET | Freq: Every day | ORAL | 3 refills | Status: DC

## 2021-12-26 ENCOUNTER — Other Ambulatory Visit: Payer: Self-pay | Admitting: Internal Medicine

## 2021-12-26 DIAGNOSIS — E538 Deficiency of other specified B group vitamins: Secondary | ICD-10-CM

## 2021-12-26 DIAGNOSIS — E559 Vitamin D deficiency, unspecified: Secondary | ICD-10-CM

## 2021-12-26 DIAGNOSIS — E7800 Pure hypercholesterolemia, unspecified: Secondary | ICD-10-CM

## 2021-12-26 DIAGNOSIS — R739 Hyperglycemia, unspecified: Secondary | ICD-10-CM

## 2022-01-12 ENCOUNTER — Other Ambulatory Visit: Payer: Self-pay | Admitting: Internal Medicine

## 2022-01-12 NOTE — Telephone Encounter (Signed)
Please refill as per office routine med refill policy (all routine meds to be refilled for 3 mo or monthly (per pt preference) up to one year from last visit, then month to month grace period for 3 mo, then further med refills will have to be denied) ? ?

## 2022-01-22 ENCOUNTER — Encounter: Payer: Self-pay | Admitting: Internal Medicine

## 2022-01-30 ENCOUNTER — Other Ambulatory Visit: Payer: Self-pay | Admitting: Internal Medicine

## 2022-01-30 MED ORDER — TIRZEPATIDE 2.5 MG/0.5ML ~~LOC~~ SOAJ: 2.5000 mg | SUBCUTANEOUS | 11 refills | Status: DC

## 2022-01-30 NOTE — Telephone Encounter (Signed)
Ok to try PA for prediabetes if they will accept this

## 2022-02-01 ENCOUNTER — Telehealth: Payer: Self-pay | Admitting: Internal Medicine

## 2022-02-01 NOTE — Telephone Encounter (Signed)
I have addressed the message via Mychart.

## 2022-02-01 NOTE — Telephone Encounter (Signed)
Patient is requesting a call back about a message she received from Dr Jenny Reichmann in [place]. Callback is [phone]

## 2022-02-08 ENCOUNTER — Other Ambulatory Visit: Payer: Self-pay | Admitting: Internal Medicine

## 2022-02-08 ENCOUNTER — Ambulatory Visit: Payer: 59 | Admitting: Internal Medicine

## 2022-02-08 VITALS — BP 122/78 | HR 78 | Temp 97.8°F | Ht 62.0 in | Wt 202.0 lb

## 2022-02-08 DIAGNOSIS — M79641 Pain in right hand: Secondary | ICD-10-CM | POA: Diagnosis not present

## 2022-02-08 DIAGNOSIS — E6609 Other obesity due to excess calories: Secondary | ICD-10-CM | POA: Diagnosis not present

## 2022-02-08 DIAGNOSIS — M79642 Pain in left hand: Secondary | ICD-10-CM

## 2022-02-08 DIAGNOSIS — E559 Vitamin D deficiency, unspecified: Secondary | ICD-10-CM | POA: Diagnosis not present

## 2022-02-08 DIAGNOSIS — Z6835 Body mass index (BMI) 35.0-35.9, adult: Secondary | ICD-10-CM

## 2022-02-08 DIAGNOSIS — M25571 Pain in right ankle and joints of right foot: Secondary | ICD-10-CM

## 2022-02-08 MED ORDER — WEGOVY 0.25 MG/0.5ML ~~LOC~~ SOAJ: 0.2500 mg | SUBCUTANEOUS | 11 refills | Status: DC

## 2022-02-08 NOTE — Patient Instructions (Signed)
Please try the OTC bilateral wrist splints at night for the hand pain  Please call if worsening, so you might need to see Hand Surgury  Ok to try the OTC Voltaren gel topical anti-inflammatory as needed for joint and ankle pain  Please take all new medication as prescribed - the [facility], but also check with your HR at [facility] as they might pay for this or the mounjaro  Please continue all other medications as before, and refills have been done if requested.  Please have the pharmacy call with any other refills you may need.  Please keep your appointments with your specialists as you may have planned

## 2022-02-08 NOTE — Progress Notes (Signed)
Patient ID: [name] [name], female   DOB: 06-01-1960, 61 y.o.   MRN: [id number]         Chief Complaint: follow up bilateral CTS, right anterior ankle pain, obesity       HPI:  [name] [name] is a 61 y.o. female here with c/o difficult to lose wt with diet, exercise, and several friends with wegovy like meds successful with wt loss.  Also has daytime mild diffuse bilateral hand discomfort not really a pain, maybe kind of numb.  Has gained wt recently.  Pt denies chest pain, increased sob or doe, wheezing, orthopnea, PND, increased LE swelling, palpitations, dizziness or syncope.   Pt denies polydipsia, polyuria, or new focal neuro s/s.    Pt denies fever, wt loss, night sweats, loss of appetite, or other constitutional symptoms  Also has mild intermittent localized pain to the right anterior ankle without swelling, worse to walk, better to sit, no recent trauma.         Wt Readings from Last 3 Encounters:  02/08/22 202 lb (91.6 kg)  09/21/21 197 lb (89.4 kg)  06/22/21 193 lb (87.5 kg)   BP Readings from Last 3 Encounters:  02/08/22 122/78  09/21/21 116/70  06/22/21 114/68         Past Medical History:  Diagnosis Date   ANEMIA-NOS 12/08/2006   BACK PAIN 12/08/2006   BREAST HYPERTROPHY 02/05/2008   DIVERTICULOSIS, COLON 04/18/2010   GERD 12/08/2006   Heart murmur    HYPERLIPIDEMIA 12/08/2006   HYPERTENSION 12/08/2006   HYPOTHYROIDISM 02/05/2008   LIPOMA 04/18/2010   Mild mitral regurgitation by prior echocardiogram 02/07/2011   OVERWEIGHT/OBESITY 01/11/2010   Shortness of breath 01/12/2009   SHOULDER PAIN, RIGHT 03/09/2007   URI 11/23/2009   URINALYSIS, ABNORMAL 11/23/2009   Past Surgical History:  Procedure Laterality Date   BREAST REDUCTION SURGERY  2012   CESAREAN SECTION     COLONOSCOPY     TONSILLECTOMY AND ADENOIDECTOMY      reports that she has never smoked. She has never used smokeless tobacco. She reports current alcohol use. She reports that she does not use  drugs. family history includes Cancer in her cousin and another family member; Diabetes in her mother; Hypertension in her brother, mother, and sister; Stroke (age of onset: 47) in her mother. Allergies  Allergen Reactions   Propoxyphene Hcl     *DARVOCET*    REACTION: nausea   Current Outpatient Medications on File Prior to Visit  Medication Sig Dispense Refill   aspirin 81 MG EC tablet Take 1 tablet (81 mg total) by mouth daily. Swallow whole. 30 tablet 12   hydrocortisone 2.5 % cream Apply topically 2 (two) times daily.     hydroquinone 4 % cream Apply topically at bedtime.     levothyroxine (SYNTHROID) 50 MCG tablet Take 1 tablet (50 mcg total) by mouth daily. 90 tablet 3   losartan-hydrochlorothiazide (HYZAAR) 50-12.5 MG tablet Take 1 tablet by mouth daily. 90 tablet 3   meloxicam (MOBIC) 7.5 MG tablet Take 1 tablet (7.5 mg total) by mouth daily. 90 tablet 2   pimecrolimus (ELIDEL) 1 % cream Apply topically daily.     potassium chloride (KLOR-CON M) 10 MEQ tablet TAKE 1 TABLET(10 MEQ) BY MOUTH DAILY 90 tablet 3   rosuvastatin (CRESTOR) 20 MG tablet Take 1 tablet (20 mg total) by mouth daily. 90 tablet 3   tirzepatide (MOUNJARO) 2.5 MG/0.5ML Pen Inject 2.5 mg into the skin once a week. 2 mL  11   triamcinolone (NASACORT) 55 MCG/ACT AERO nasal inhaler Place 2 sprays into the nose daily. INSTILL 2 SPRAYS INTO NOSTRIL DAILY Strength: 55 MCG/ACT 1 each 3   triamcinolone cream (KENALOG) 0.1 % Apply topically.     Vitamin D, Ergocalciferol, (DRISDOL) 1.25 MG (50000 UNIT) CAPS capsule Take 50,000 Units by mouth once a week.     No current facility-administered medications on file prior to visit.        ROS:  All others reviewed and negative.  Objective        PE:  BP 122/78 (BP Location: Right Arm, Patient Position: Standing, Cuff Size: Large)   Pulse 78   Temp 97.8 F (36.6 C) (Oral)   Ht '5\' 2"'$  (1.575 m)   Wt 202 lb (91.6 kg)   LMP  (LMP Unknown)   SpO2 94%   BMI 36.95 kg/m                  Constitutional: Pt appears in NAD               HENT: Head: NCAT.                Right Ear: External ear normal.                 Left Ear: External ear normal.                Eyes: . Pupils are equal, round, and reactive to light. Conjunctivae and EOM are normal               Nose: without d/c or deformity               Neck: Neck supple. Gross normal ROM               Cardiovascular: Normal rate and regular rhythm.                 Pulmonary/Chest: Effort normal and breath sounds without rales or wheezing.                Abd:  Soft, NT, ND, + BS, no organomegaly               Neurological: Pt is alert. At baseline orientation, motor grossly intact               Skin: Skin is warm. No rashes, no other new lesions, LE edema - none, right ankle and bilateral hands -  non tender, no swelling               Psychiatric: Pt behavior is normal without agitation   Micro: none  Cardiac tracings I have personally interpreted today:  none  Pertinent Radiological findings (summarize): none   Lab Results  Component Value Date   WBC 5.2 09/24/2021   HGB 13.0 09/24/2021   HCT 38.7 09/24/2021   PLT 272.0 09/24/2021   GLUCOSE 93 09/24/2021   CHOL 153 09/24/2021   TRIG 87.0 09/24/2021   HDL 56.90 09/24/2021   LDLCALC 79 09/24/2021   ALT 15 09/24/2021   AST 14 09/24/2021   NA 143 09/24/2021   K 3.5 09/24/2021   CL 108 09/24/2021   CREATININE 0.69 09/24/2021   BUN 11 09/24/2021   CO2 28 09/24/2021   TSH 4.67 09/24/2021   HGBA1C 6.0 09/24/2021   MICROALBUR 15.0 (H) 09/24/2021   Assessment/Plan:  [name] [name] is a 61 y.o. Black or African American [2] female  with  has a past medical history of ANEMIA-NOS (12/08/2006), BACK PAIN (12/08/2006), BREAST HYPERTROPHY (02/05/2008), DIVERTICULOSIS, COLON (04/18/2010), GERD (12/08/2006), Heart murmur, HYPERLIPIDEMIA (12/08/2006), HYPERTENSION (12/08/2006), HYPOTHYROIDISM (02/05/2008), LIPOMA (04/18/2010), Mild mitral regurgitation by prior  echocardiogram (02/07/2011), OVERWEIGHT/OBESITY (01/11/2010), Shortness of breath (01/12/2009), SHOULDER PAIN, RIGHT (03/09/2007), URI (11/23/2009), and URINALYSIS, ABNORMAL (11/23/2009).  Obesity BMI > 35 - ok for wegovy start if ok with insurance  Bilateral hand pain Exam benign, I suspect mild bilateral CTS possibly wt gain related, for night time wrsit splints,  to f/u any worsening symptoms or concerns  Right ankle pain Very mild, exam benign, suspect mild tendonitis at the the ankle level, for otc volt gel prn  Vitamin D deficiency Last vitamin D Lab Results  Component Value Date   VD25OH 36.75 09/24/2021   Low, reminded to start oral replacement  Followup: Return in about 3 months (around 05/10/2022).  Cathlean Cower, MD 02/10/2022 3:42 PM [facility]

## 2022-02-10 ENCOUNTER — Encounter: Payer: Self-pay | Admitting: Internal Medicine

## 2022-02-10 DIAGNOSIS — M79641 Pain in right hand: Secondary | ICD-10-CM | POA: Insufficient documentation

## 2022-02-10 DIAGNOSIS — M25571 Pain in right ankle and joints of right foot: Secondary | ICD-10-CM | POA: Insufficient documentation

## 2022-02-10 NOTE — Assessment & Plan Note (Signed)
Last vitamin D Lab Results  Component Value Date   VD25OH 36.75 09/24/2021   Low, reminded to start oral replacement

## 2022-02-10 NOTE — Assessment & Plan Note (Signed)
BMI > 35 - ok for wegovy start if ok with insurance

## 2022-02-10 NOTE — Assessment & Plan Note (Signed)
Exam benign, I suspect mild bilateral CTS possibly wt gain related, for night time wrsit splints,  to f/u any worsening symptoms or concerns

## 2022-02-10 NOTE — Assessment & Plan Note (Signed)
Very mild, exam benign, suspect mild tendonitis at the the ankle level, for otc volt gel prn

## 2022-02-20 ENCOUNTER — Telehealth: Payer: Self-pay

## 2022-02-20 NOTE — Telephone Encounter (Signed)
PA started   Key: [id number]

## 2022-08-23 ENCOUNTER — Encounter: Payer: Self-pay | Admitting: Internal Medicine

## 2022-08-23 ENCOUNTER — Ambulatory Visit: Payer: 59 | Admitting: Internal Medicine

## 2022-08-23 VITALS — BP 110/78 | HR 74 | Temp 98.7°F | Ht 62.0 in | Wt 196.0 lb

## 2022-08-23 DIAGNOSIS — Z0001 Encounter for general adult medical examination with abnormal findings: Secondary | ICD-10-CM | POA: Diagnosis not present

## 2022-08-23 DIAGNOSIS — I10 Essential (primary) hypertension: Secondary | ICD-10-CM

## 2022-08-23 DIAGNOSIS — J019 Acute sinusitis, unspecified: Secondary | ICD-10-CM | POA: Insufficient documentation

## 2022-08-23 DIAGNOSIS — R739 Hyperglycemia, unspecified: Secondary | ICD-10-CM | POA: Diagnosis not present

## 2022-08-23 DIAGNOSIS — D1721 Benign lipomatous neoplasm of skin and subcutaneous tissue of right arm: Secondary | ICD-10-CM | POA: Diagnosis not present

## 2022-08-23 DIAGNOSIS — E7800 Pure hypercholesterolemia, unspecified: Secondary | ICD-10-CM | POA: Diagnosis not present

## 2022-08-23 DIAGNOSIS — E538 Deficiency of other specified B group vitamins: Secondary | ICD-10-CM

## 2022-08-23 DIAGNOSIS — E559 Vitamin D deficiency, unspecified: Secondary | ICD-10-CM

## 2022-08-23 MED ORDER — GUAIFENESIN ER 600 MG PO TB12: 1200.0000 mg | ORAL_TABLET | Freq: Two times a day (BID) | ORAL | 1 refills | Status: DC | PRN

## 2022-08-23 MED ORDER — AMOXICILLIN-POT CLAVULANATE 500-125 MG PO TABS: 1.0000 | ORAL_TABLET | Freq: Three times a day (TID) | ORAL | 0 refills | Status: DC

## 2022-08-23 MED ORDER — HYDROCODONE BIT-HOMATROP MBR 5-1.5 MG/5ML PO SOLN: 5.0000 mL | Freq: Four times a day (QID) | ORAL | 0 refills | Status: AC | PRN

## 2022-08-23 NOTE — Progress Notes (Unsigned)
Patient ID: [name] [name], female   DOB: 15-Mar-1960, 62 y.o.   MRN: [id number]         Chief Complaint:: wellness exam and Sinus Problem (SINUS pressure, headache, scratchy throat x2 weeks)  , right wrist lipoma, htn, hld, hyperglycemia       HPI:  [name] [name] is a 62 y.o. female here for wellness exam; pt plans to call for GYN appt soon for pap, o/w up to date                        Also  Here with 2-3 days acute onset fever, facial pain, pressure, headache, general weakness and malaise, and greenish d/c, with mild ST and cough, but pt denies chest pain, wheezing, increased sob or doe, orthopnea, PND, increased LE swelling, palpitations, dizziness or syncope.   Pt denies polydipsia, polyuria, or new focal neuro s/s.    Pt denies fever, wt loss, night sweats, loss of appetite, or other constitutional symptoms  Also has enlarging post right wrist fatty lump.     Wt Readings from Last 3 Encounters:  08/23/22 196 lb (88.9 kg)  02/08/22 202 lb (91.6 kg)  09/21/21 197 lb (89.4 kg)   BP Readings from Last 3 Encounters:  08/23/22 110/78  02/08/22 122/78  09/21/21 116/70   Immunization History  Administered Date(s) Administered   Influenza Split 01/29/2011, 02/26/2012   Influenza Whole 12/29/2007, 11/25/2008   Influenza,inj,Quad PF,6+ Mos 12/08/2013, 12/15/2014, 01/09/2018, 12/08/2018, 01/10/2021   Influenza-Unspecified 11/25/2016   Moderna Sars-Covid-2 Vaccination 04/29/2019, 06/01/2019, 01/07/2020   Td 02/05/2008   Td (Adult), 2 Lf Tetanus Toxid, Preservative Free 02/05/2008   Tdap 12/08/2018   Zoster Recombinant(Shingrix) 08/09/2020, 01/10/2021   Health Maintenance Due  Topic Date Due   PAP SMEAR-Modifier  12/25/2019      Past Medical History:  Diagnosis Date   ANEMIA-NOS 12/08/2006   BACK PAIN 12/08/2006   BREAST HYPERTROPHY 02/05/2008   DIVERTICULOSIS, COLON 04/18/2010   GERD 12/08/2006   Heart murmur    HYPERLIPIDEMIA 12/08/2006   HYPERTENSION 12/08/2006    HYPOTHYROIDISM 02/05/2008   LIPOMA 04/18/2010   Mild mitral regurgitation by prior echocardiogram 02/07/2011   OVERWEIGHT/OBESITY 01/11/2010   Shortness of breath 01/12/2009   SHOULDER PAIN, RIGHT 03/09/2007   URI 11/23/2009   URINALYSIS, ABNORMAL 11/23/2009   Past Surgical History:  Procedure Laterality Date   BREAST REDUCTION SURGERY  2012   CESAREAN SECTION     COLONOSCOPY     TONSILLECTOMY AND ADENOIDECTOMY      reports that she has never smoked. She has never used smokeless tobacco. She reports current alcohol use. She reports that she does not use drugs. family history includes Cancer in her cousin and another family member; Diabetes in her mother; Hypertension in her brother, mother, and sister; Stroke (age of onset: 35) in her mother. Allergies  Allergen Reactions   Propoxyphene Hcl     *DARVOCET*    REACTION: nausea   Current Outpatient Medications on File Prior to Visit  Medication Sig Dispense Refill   aspirin 81 MG EC tablet Take 1 tablet (81 mg total) by mouth daily. Swallow whole. 30 tablet 12   hydrocortisone 2.5 % cream Apply topically 2 (two) times daily.     hydroquinone 4 % cream Apply topically at bedtime.     levothyroxine (SYNTHROID) 50 MCG tablet Take 1 tablet (50 mcg total) by mouth daily. 90 tablet 3   losartan-hydrochlorothiazide (HYZAAR) 50-12.5 MG tablet Take  1 tablet by mouth daily. 90 tablet 3   meloxicam (MOBIC) 7.5 MG tablet Take 1 tablet (7.5 mg total) by mouth daily. 90 tablet 2   pimecrolimus (ELIDEL) 1 % cream Apply topically daily.     potassium chloride (KLOR-CON M) 10 MEQ tablet TAKE 1 TABLET(10 MEQ) BY MOUTH DAILY 90 tablet 3   rosuvastatin (CRESTOR) 20 MG tablet Take 1 tablet (20 mg total) by mouth daily. 90 tablet 3   Semaglutide-Weight Management (WEGOVY) 0.25 MG/0.5ML SOAJ Inject 0.25 mg into the skin once a week. 2 mL 11   tirzepatide (MOUNJARO) 2.5 MG/0.5ML Pen Inject 2.5 mg into the skin once a week. 2 mL 11   triamcinolone (NASACORT) 55  MCG/ACT AERO nasal inhaler Place 2 sprays into the nose daily. INSTILL 2 SPRAYS INTO NOSTRIL DAILY Strength: 55 MCG/ACT 1 each 3   triamcinolone cream (KENALOG) 0.1 % Apply topically.     No current facility-administered medications on file prior to visit.        ROS:  All others reviewed and negative.  Objective        PE:  BP 110/78 (BP Location: Right Arm, Patient Position: Sitting, Cuff Size: Normal)   Pulse 74   Temp 98.7 F (37.1 C) (Oral)   Ht 5\' 2"  (1.575 m)   Wt 196 lb (88.9 kg)   LMP  (LMP Unknown)   SpO2 97%   BMI 35.85 kg/m                 Constitutional: Pt appears in NAD               HENT: Head: NCAT.                Right Ear: External ear normal.                 Left Ear: External ear normal. Bilat tm's with mild erythema.  Max sinus areas mild tender.  Pharynx with mild erythema, no exudate                Eyes: . Pupils are equal, round, and reactive to light. Conjunctivae and EOM are normal               Nose: without d/c or deformity               Neck: Neck supple. Gross normal ROM               Cardiovascular: Normal rate and regular rhythm.                 Pulmonary/Chest: Effort normal and breath sounds without rales or wheezing.                Abd:  Soft, NT, ND, + BS, no organomegaly               Neurological: Pt is alert. At baseline orientation, motor grossly intact               Skin: Skin is warm. LE edema - none, also 3 x 4 ight post wrist lipoma               Psychiatric: Pt behavior is normal without agitation   Micro: none  Cardiac tracings I have personally interpreted today:  none  Pertinent Radiological findings (summarize): none   Lab Results  Component Value Date   WBC 5.2 09/24/2021   HGB 13.0 09/24/2021   HCT 38.7 09/24/2021  PLT 272.0 09/24/2021   GLUCOSE 93 09/24/2021   CHOL 153 09/24/2021   TRIG 87.0 09/24/2021   HDL 56.90 09/24/2021   LDLCALC 79 09/24/2021   ALT 15 09/24/2021   AST 14 09/24/2021   NA 143 09/24/2021    K 3.5 09/24/2021   CL 108 09/24/2021   CREATININE 0.69 09/24/2021   BUN 11 09/24/2021   CO2 28 09/24/2021   TSH 4.67 09/24/2021   HGBA1C 6.0 09/24/2021   MICROALBUR 15.0 (H) 09/24/2021   Assessment/Plan:  [name] [name] is a 62 y.o. Black or African American [2] female with  has a past medical history of ANEMIA-NOS (12/08/2006), BACK PAIN (12/08/2006), BREAST HYPERTROPHY (02/05/2008), DIVERTICULOSIS, COLON (04/18/2010), GERD (12/08/2006), Heart murmur, HYPERLIPIDEMIA (12/08/2006), HYPERTENSION (12/08/2006), HYPOTHYROIDISM (02/05/2008), LIPOMA (04/18/2010), Mild mitral regurgitation by prior echocardiogram (02/07/2011), OVERWEIGHT/OBESITY (01/11/2010), Shortness of breath (01/12/2009), SHOULDER PAIN, RIGHT (03/09/2007), URI (11/23/2009), and URINALYSIS, ABNORMAL (11/23/2009).  Encounter for well adult exam with abnormal findings Age and sex appropriate education and counseling updated with regular exercise and diet Referrals for preventative services - pt to call for GYN appt soon for pap Immunizations addressed - none needed Smoking counseling  - none needed Evidence for depression or other mood disorder - none significant Most recent labs reviewed. I have personally reviewed and have noted: 1) the patient's medical and social history 2) The patient's current medications and supplements 3) The patient's height, weight, and BMI have been recorded in the chart   Hyperlipidemia Lab Results  Component Value Date   LDLCALC 79 09/24/2021   Uncontrolled, goal ldl < 70, pt to continue current statin crestor 20 mg every day, and lower chol diet, declines other change for now   Essential hypertension BP Readings from Last 3 Encounters:  08/23/22 110/78  02/08/22 122/78  09/21/21 116/70   Stable, pt to continue medical treatment hyzaar 50-12.5 qd   Hyperglycemia Lab Results  Component Value Date   HGBA1C 6.0 09/24/2021   Stable, pt to continue current medical treatment mounjaro 2.5  mg weekly   Acute sinusitis Mild to mod, for antibx course, cough med prn, mucinex bid prn,,  to f/u any worsening symptoms or concerns  Lipoma Worsening, for general surgury referral  Vitamin D deficiency Last vitamin D Lab Results  Component Value Date   VD25OH 36.75 09/24/2021   Low, to start oral replacement   Followup: Return in about 1 year (around 08/23/2023).  Oliver Barre, MD 08/25/2022 9:40 PM [facility]

## 2022-08-23 NOTE — Patient Instructions (Addendum)
Please take all new medication as prescribed - the antibiotic, cough medicine, and mucinex  Please continue all other medications as before, and refills have been done if requested.  Please have the pharmacy call with any other refills you may need.  Please continue your efforts at being more active, low cholesterol diet, and weight control.  You are otherwise up to date with prevention measures today.  Please keep your appointments with your specialists as you may have planned  You will be contacted regarding the referral for: General Surgury  Please go to the LAB at the blood drawing area for the tests to be done  You will be contacted by phone if any changes need to be made immediately.  Otherwise, you will receive a letter about your results with an explanation, but please check with MyChart first.  Please remember to sign up for MyChart if you have not done so, as this will be important to you in the future with finding out test results, communicating by private email, and scheduling acute appointments online when needed.  Please make an Appointment to return for your 1 year visit, or sooner if needed, with Lab testing by Appointment as well, to be done about 3-5 days before at the FIRST FLOOR Lab (so this is for TWO appointments - please see the scheduling desk as you leave)

## 2022-08-25 ENCOUNTER — Encounter: Payer: Self-pay | Admitting: Internal Medicine

## 2022-08-25 NOTE — Assessment & Plan Note (Signed)
Lab Results  Component Value Date   LDLCALC 79 09/24/2021   Uncontrolled, goal ldl < 70, pt to continue current statin crestor 20 mg every day, and lower chol diet, declines other change for now

## 2022-08-25 NOTE — Assessment & Plan Note (Signed)
Mild to mod, for antibx course, cough med prn, mucinex bid prn,,  to f/u any worsening symptoms or concerns

## 2022-08-25 NOTE — Assessment & Plan Note (Signed)
Lab Results  Component Value Date   HGBA1C 6.0 09/24/2021   Stable, pt to continue current medical treatment mounjaro 2.5 mg weekly

## 2022-08-25 NOTE — Assessment & Plan Note (Signed)
Age and sex appropriate education and counseling updated with regular exercise and diet Referrals for preventative services - pt to call for GYN appt soon for pap Immunizations addressed - none needed Smoking counseling  - none needed Evidence for depression or other mood disorder - none significant Most recent labs reviewed. I have personally reviewed and have noted: 1) the patient's medical and social history 2) The patient's current medications and supplements 3) The patient's height, weight, and BMI have been recorded in the chart

## 2022-08-25 NOTE — Assessment & Plan Note (Signed)
BP Readings from Last 3 Encounters:  08/23/22 110/78  02/08/22 122/78  09/21/21 116/70   Stable, pt to continue medical treatment hyzaar 50-12.5 qd

## 2022-08-25 NOTE — Assessment & Plan Note (Signed)
Worsening, for general surgury referral

## 2022-08-25 NOTE — Assessment & Plan Note (Signed)
Last vitamin D Lab Results  Component Value Date   VD25OH 36.75 09/24/2021   Low, to start oral replacement

## 2022-09-29 ENCOUNTER — Other Ambulatory Visit: Payer: Self-pay | Admitting: Internal Medicine

## 2022-10-07 ENCOUNTER — Other Ambulatory Visit: Payer: Self-pay | Admitting: Internal Medicine

## 2022-10-07 ENCOUNTER — Other Ambulatory Visit: Payer: Self-pay

## 2022-10-24 ENCOUNTER — Other Ambulatory Visit: Payer: Self-pay | Admitting: Internal Medicine

## 2022-10-24 ENCOUNTER — Other Ambulatory Visit: Payer: Self-pay

## 2022-11-19 ENCOUNTER — Encounter: Payer: 59 | Admitting: Internal Medicine

## 2022-11-26 ENCOUNTER — Encounter: Payer: 59 | Admitting: Internal Medicine

## 2022-11-26 ENCOUNTER — Other Ambulatory Visit (INDEPENDENT_AMBULATORY_CARE_PROVIDER_SITE_OTHER): Payer: 59

## 2022-11-26 DIAGNOSIS — E7800 Pure hypercholesterolemia, unspecified: Secondary | ICD-10-CM

## 2022-11-26 DIAGNOSIS — R739 Hyperglycemia, unspecified: Secondary | ICD-10-CM

## 2022-11-26 DIAGNOSIS — E559 Vitamin D deficiency, unspecified: Secondary | ICD-10-CM | POA: Diagnosis not present

## 2022-11-26 DIAGNOSIS — E538 Deficiency of other specified B group vitamins: Secondary | ICD-10-CM

## 2022-11-26 LAB — LIPID PANEL
Cholesterol: 199 mg/dL (ref 0–200)
HDL: 61.8 mg/dL (ref >39.00)
LDL Cholesterol: 118 mg/dL — ABNORMAL HIGH (ref 0–99)
NonHDL: 136.93
Total CHOL/HDL Ratio: 3
Triglycerides: 95 mg/dL (ref 0.0–149.0)
VLDL: 19 mg/dL (ref 0.0–40.0)

## 2022-11-26 LAB — TSH: TSH: 4.45 u[IU]/mL (ref 0.35–5.50)

## 2022-11-26 LAB — CBC WITH DIFFERENTIAL/PLATELET
Basophils Absolute: 0 10*3/uL (ref 0.0–0.1)
Basophils Relative: 0.6 % (ref 0.0–3.0)
Eosinophils Absolute: 0.2 10*3/uL (ref 0.0–0.7)
Eosinophils Relative: 3.6 % (ref 0.0–5.0)
HCT: 44.2 % (ref 36.0–46.0)
Hemoglobin: 14.6 g/dL (ref 12.0–15.0)
Lymphocytes Relative: 39.1 % (ref 12.0–46.0)
Lymphs Abs: 2.4 10*3/uL (ref 0.7–4.0)
MCHC: 32.9 g/dL (ref 30.0–36.0)
MCV: 90.5 fL (ref 78.0–100.0)
Monocytes Absolute: 0.5 10*3/uL (ref 0.1–1.0)
Monocytes Relative: 7.8 % (ref 3.0–12.0)
Neutro Abs: 3 10*3/uL (ref 1.4–7.7)
Neutrophils Relative %: 48.9 % (ref 43.0–77.0)
Platelets: 320 10*3/uL (ref 150.0–400.0)
RBC: 4.89 Mil/uL (ref 3.87–5.11)
RDW: 14.5 % (ref 11.5–15.5)
WBC: 6.2 10*3/uL (ref 4.0–10.5)

## 2022-11-26 LAB — URINALYSIS, ROUTINE W REFLEX MICROSCOPIC
Ketones, ur: NEGATIVE
Nitrite: NEGATIVE
Specific Gravity, Urine: >=1.03 — AB (ref 1.000–1.030)
Total Protein, Urine: 30 — AB
Urine Glucose: NEGATIVE
Urobilinogen, UA: 0.2 (ref 0.0–1.0)
pH: 6 (ref 5.0–8.0)

## 2022-11-26 LAB — BASIC METABOLIC PANEL
BUN: 13 mg/dL (ref 6–23)
CO2: 26 meq/L (ref 19–32)
Calcium: 9 mg/dL (ref 8.4–10.5)
Chloride: 105 meq/L (ref 96–112)
Creatinine, Ser: 0.75 mg/dL (ref 0.40–1.20)
GFR: 84.26 mL/min (ref >60.00)
Glucose, Bld: 109 mg/dL — ABNORMAL HIGH (ref 70–99)
Potassium: 3.4 meq/L — ABNORMAL LOW (ref 3.5–5.1)
Sodium: 140 meq/L (ref 135–145)

## 2022-11-26 LAB — HEPATIC FUNCTION PANEL
ALT: 18 U/L (ref 0–35)
AST: 17 U/L (ref 0–37)
Albumin: 3.9 g/dL (ref 3.5–5.2)
Alkaline Phosphatase: 101 U/L (ref 39–117)
Bilirubin, Direct: 0.1 mg/dL (ref 0.0–0.3)
Total Bilirubin: 0.6 mg/dL (ref 0.2–1.2)
Total Protein: 7 g/dL (ref 6.0–8.3)

## 2022-11-26 LAB — VITAMIN D 25 HYDROXY (VIT D DEFICIENCY, FRACTURES): VITD: 15.29 ng/mL — ABNORMAL LOW (ref 30.00–100.00)

## 2022-11-26 LAB — VITAMIN B12: Vitamin B-12: 412 pg/mL (ref 211–911)

## 2022-11-26 LAB — HEMOGLOBIN A1C: Hgb A1c MFr Bld: 5.8 % (ref 4.6–6.5)

## 2022-11-27 LAB — HM MAMMOGRAPHY

## 2022-11-28 ENCOUNTER — Ambulatory Visit (INDEPENDENT_AMBULATORY_CARE_PROVIDER_SITE_OTHER): Payer: 59 | Admitting: Internal Medicine

## 2022-11-28 ENCOUNTER — Encounter: Payer: Self-pay | Admitting: Internal Medicine

## 2022-11-28 VITALS — BP 124/80 | HR 98 | Temp 98.5°F | Ht 62.0 in | Wt 197.0 lb

## 2022-11-28 DIAGNOSIS — R062 Wheezing: Secondary | ICD-10-CM

## 2022-11-28 DIAGNOSIS — E876 Hypokalemia: Secondary | ICD-10-CM

## 2022-11-28 DIAGNOSIS — E7800 Pure hypercholesterolemia, unspecified: Secondary | ICD-10-CM | POA: Diagnosis not present

## 2022-11-28 DIAGNOSIS — R3129 Other microscopic hematuria: Secondary | ICD-10-CM | POA: Diagnosis not present

## 2022-11-28 DIAGNOSIS — E559 Vitamin D deficiency, unspecified: Secondary | ICD-10-CM

## 2022-11-28 DIAGNOSIS — E538 Deficiency of other specified B group vitamins: Secondary | ICD-10-CM

## 2022-11-28 DIAGNOSIS — R739 Hyperglycemia, unspecified: Secondary | ICD-10-CM

## 2022-11-28 DIAGNOSIS — R051 Acute cough: Secondary | ICD-10-CM

## 2022-11-28 MED ORDER — PREDNISONE 10 MG PO TABS: ORAL_TABLET | ORAL | 0 refills | Status: DC

## 2022-11-28 MED ORDER — HYDROCODONE BIT-HOMATROP MBR 5-1.5 MG/5ML PO SOLN: 5.0000 mL | Freq: Four times a day (QID) | ORAL | 0 refills | Status: AC | PRN

## 2022-11-28 MED ORDER — AZITHROMYCIN 250 MG PO TABS: ORAL_TABLET | ORAL | 1 refills | Status: AC

## 2022-11-28 NOTE — Progress Notes (Signed)
Patient ID: [name] [name], female   DOB: [birth year], 62 y.o.   MRN: [id number]        Chief Complaint: follow up cough, wheezing, low vit d, microhematuria, low K, hld       HPI:  [name] [name] is a 62 y.o. female Here with acute onset mild to mod 2-3 days ST, HA, general weakness and malaise, with prod cough greenish sputum, but Pt denies chest pain, increased sob or doe, wheezing, orthopnea, PND, increased LE swelling, palpitations, dizziness or syncope, except for onset wheezing sob since last pm.  Not taking Vit d.   Pt denies polydipsia, polyuria, or new focal neuro s/s.    Pt denies wt loss, night sweats, loss of appetite, or other constitutional symptoms   Denies urinary symptoms such as dysuria, frequency, urgency, flank pain, hematuria or n/v, fever, chills.        Wt Readings from Last 3 Encounters:  11/28/22 197 lb (89.4 kg)  08/23/22 196 lb (88.9 kg)  02/08/22 202 lb (91.6 kg)   BP Readings from Last 3 Encounters:  11/28/22 124/80  08/23/22 110/78  02/08/22 122/78         Past Medical History:  Diagnosis Date   ANEMIA-NOS 12/08/2006   BACK PAIN 12/08/2006   BREAST HYPERTROPHY 02/05/2008   DIVERTICULOSIS, COLON 04/18/2010   GERD 12/08/2006   Heart murmur    HYPERLIPIDEMIA 12/08/2006   HYPERTENSION 12/08/2006   HYPOTHYROIDISM 02/05/2008   LIPOMA 04/18/2010   Mild mitral regurgitation by prior echocardiogram 02/07/2011   OVERWEIGHT/OBESITY 01/11/2010   Shortness of breath 01/12/2009   SHOULDER PAIN, RIGHT 03/09/2007   URI 11/23/2009   URINALYSIS, ABNORMAL 11/23/2009   Past Surgical History:  Procedure Laterality Date   BREAST REDUCTION SURGERY  2012   CESAREAN SECTION     COLONOSCOPY     TONSILLECTOMY AND ADENOIDECTOMY      reports that she has never smoked. She has never used smokeless tobacco. She reports current alcohol use. She reports that she does not use drugs. family history includes Cancer in her cousin and another family member; Diabetes in her mother;  Hypertension in her brother, mother, and sister; Stroke (age of onset: 4) in her mother. Allergies  Allergen Reactions   Propoxyphene Hcl     *DARVOCET*    REACTION: nausea   Current Outpatient Medications on File Prior to Visit  Medication Sig Dispense Refill   aspirin 81 MG EC tablet Take 1 tablet (81 mg total) by mouth daily. Swallow whole. 30 tablet 12   guaiFENesin (MUCINEX) 600 MG 12 hr tablet Take 2 tablets (1,200 mg total) by mouth 2 (two) times daily as needed. 60 tablet 1   hydrocortisone 2.5 % cream Apply topically 2 (two) times daily.     hydroquinone 4 % cream Apply topically at bedtime.     levothyroxine (SYNTHROID) 50 MCG tablet TAKE 1 TABLET BY MOUTH EVERY DAY 90 tablet 3   losartan-hydrochlorothiazide (HYZAAR) 50-12.5 MG tablet TAKE 1 TABLET BY MOUTH EVERY DAY 90 tablet 3   meloxicam (MOBIC) 7.5 MG tablet Take 1 tablet (7.5 mg total) by mouth daily. 90 tablet 2   pimecrolimus (ELIDEL) 1 % cream Apply topically daily.     potassium chloride (KLOR-CON M10) 10 MEQ tablet TAKE 1 TABLET BY MOUTH EVERY DAY 90 tablet 2   rosuvastatin (CRESTOR) 20 MG tablet TAKE 1 TABLET BY MOUTH EVERY DAY 90 tablet 3   Semaglutide-Weight Management (WEGOVY) 0.25 MG/0.5ML SOAJ Inject 0.25 mg into  the skin once a week. 2 mL 11   tirzepatide (MOUNJARO) 2.5 MG/0.5ML Pen Inject 2.5 mg into the skin once a week. 2 mL 11   triamcinolone (NASACORT) 55 MCG/ACT AERO nasal inhaler Place 2 sprays into the nose daily. INSTILL 2 SPRAYS INTO NOSTRIL DAILY Strength: 55 MCG/ACT 1 each 3   triamcinolone cream (KENALOG) 0.1 % Apply topically.     amoxicillin-clavulanate (AUGMENTIN) 500-125 MG tablet Take 1 tablet by mouth 3 (three) times daily. (Patient not taking: Reported on 11/28/2022) 20 tablet 0   No current facility-administered medications on file prior to visit.        ROS:  All others reviewed and negative.  Objective        PE:  BP 124/80 (BP Location: Right Arm, Patient Position: Sitting, Cuff Size:  Normal)   Pulse 98   Temp 98.5 F (36.9 C) (Oral)   Ht 5\' 2"  (1.575 m)   Wt 197 lb (89.4 kg)   LMP  (LMP Unknown)   SpO2 98%   BMI 36.03 kg/m                 Constitutional: Pt appears in NAD, mild ill               HENT: Head: NCAT.                Right Ear: External ear normal.                 Left Ear: External ear normal. Bilat tm's with mild erythema.  Max sinus areas non tender.  Pharynx with mild erythema, no exudate               Eyes: . Pupils are equal, round, and reactive to light. Conjunctivae and EOM are normal               Nose: without d/c or deformity               Neck: Neck supple. Gross normal ROM               Cardiovascular: Normal rate and regular rhythm.                 Pulmonary/Chest: Effort normal and breath sounds without rales bu with few bilateral wheezing.                               Neurological: Pt is alert. At baseline orientation, motor grossly intact               Skin: Skin is warm. No rashes, no other new lesions, LE edema - none               Psychiatric: Pt behavior is normal without agitation   Micro: none  Cardiac tracings I have personally interpreted today:  none  Pertinent Radiological findings (summarize): none   Lab Results  Component Value Date   WBC 6.2 11/26/2022   HGB 14.6 11/26/2022   HCT 44.2 11/26/2022   PLT 320.0 11/26/2022   GLUCOSE 109 (H) 11/26/2022   CHOL 199 11/26/2022   TRIG 95.0 11/26/2022   HDL 61.80 11/26/2022   LDLCALC 118 (H) 11/26/2022   ALT 18 11/26/2022   AST 17 11/26/2022   NA 140 11/26/2022   K 3.4 (L) 11/26/2022   CL 105 11/26/2022   CREATININE 0.75 11/26/2022   BUN 13 11/26/2022   CO2  26 11/26/2022   TSH 4.45 11/26/2022   HGBA1C 5.8 11/26/2022   MICROALBUR 15.0 (H) 09/24/2021   Assessment/Plan:  [name] [name] is a 62 y.o. Black or African American [2] female with  has a past medical history of ANEMIA-NOS (12/08/2006), BACK PAIN (12/08/2006), BREAST HYPERTROPHY (02/05/2008),  DIVERTICULOSIS, COLON (04/18/2010), GERD (12/08/2006), Heart murmur, HYPERLIPIDEMIA (12/08/2006), HYPERTENSION (12/08/2006), HYPOTHYROIDISM (02/05/2008), LIPOMA (04/18/2010), Mild mitral regurgitation by prior echocardiogram (02/07/2011), OVERWEIGHT/OBESITY (01/11/2010), Shortness of breath (01/12/2009), SHOULDER PAIN, RIGHT (03/09/2007), URI (11/23/2009), and URINALYSIS, ABNORMAL (11/23/2009).  Hyperlipidemia Lab Results  Component Value Date   LDLCALC 118 (H) 11/26/2022   Uncontrolled, goal ldl < 70, pt to restart crestor 20 qd   Vitamin D deficiency Last vitamin D Lab Results  Component Value Date   VD25OH 15.29 (L) 11/26/2022   Low, to start oral replacement   Microhematuria Asympt, for urology referral r/o malignancy  Cough Mild to mod, c/w bornchitis vs pna, for antibx course, cough med prn, declines cxr,  to f/u any worsening symptoms or concerns  Wheezing Mild to mod, for prednisone taper,,  to f/u any worsening symptoms or concerns  Hypokalemia Ok to restart k supplement  Followup: Return in about 6 months (around 05/29/2023).  Oliver Barre, MD 12/01/2022 6:01 PM  [facility] - [facility]

## 2022-11-28 NOTE — Patient Instructions (Addendum)
Please take OTC Vitamin D3 at 2000 units per day, indefinitely  Please take all new medication as prescribed - the antibiotic, cough medicine, and prednisone  Please continue all other medications as before, and refills have been done if requested.  Please have the pharmacy call with any other refills you may need.  Please continue your efforts at being more active, low cholesterol diet, and weight control.  Please keep your appointments with your specialists as you may have planned  You will be contacted regarding the referral for: urology  Please make an Appointment to return in 6 months, or sooner if needed

## 2022-12-01 ENCOUNTER — Encounter: Payer: Self-pay | Admitting: Internal Medicine

## 2022-12-01 DIAGNOSIS — R059 Cough, unspecified: Secondary | ICD-10-CM | POA: Insufficient documentation

## 2022-12-01 DIAGNOSIS — R062 Wheezing: Secondary | ICD-10-CM | POA: Insufficient documentation

## 2022-12-01 DIAGNOSIS — R3129 Other microscopic hematuria: Secondary | ICD-10-CM | POA: Insufficient documentation

## 2022-12-01 NOTE — Assessment & Plan Note (Signed)
Mild to mod, c/w bornchitis vs pna, for antibx course, cough med prn, declines cxr,  to f/u any worsening symptoms or concerns

## 2022-12-01 NOTE — Assessment & Plan Note (Signed)
Last vitamin D Lab Results  Component Value Date   VD25OH 15.29 (L) 11/26/2022   Low, to start oral replacement

## 2022-12-01 NOTE — Assessment & Plan Note (Signed)
Ok to restart k supplement

## 2022-12-01 NOTE — Assessment & Plan Note (Signed)
Lab Results  Component Value Date   LDLCALC 118 (H) 11/26/2022   Uncontrolled, goal ldl < 70, pt to restart crestor 20 qd

## 2022-12-01 NOTE — Assessment & Plan Note (Signed)
Asympt, for urology referral r/o malignancy

## 2022-12-01 NOTE — Assessment & Plan Note (Signed)
Mild to mod, for prednisone taper,,  to f/u any worsening symptoms or concerns  

## 2023-05-27 ENCOUNTER — Other Ambulatory Visit (INDEPENDENT_AMBULATORY_CARE_PROVIDER_SITE_OTHER)

## 2023-05-27 DIAGNOSIS — E7800 Pure hypercholesterolemia, unspecified: Secondary | ICD-10-CM

## 2023-05-27 DIAGNOSIS — E538 Deficiency of other specified B group vitamins: Secondary | ICD-10-CM

## 2023-05-27 DIAGNOSIS — E559 Vitamin D deficiency, unspecified: Secondary | ICD-10-CM

## 2023-05-27 DIAGNOSIS — R739 Hyperglycemia, unspecified: Secondary | ICD-10-CM | POA: Diagnosis not present

## 2023-05-27 LAB — VITAMIN D 25 HYDROXY (VIT D DEFICIENCY, FRACTURES): VITD: 20.38 ng/mL — ABNORMAL LOW (ref 30.00–100.00)

## 2023-05-27 LAB — LIPID PANEL
Cholesterol: 132 mg/dL (ref 0–200)
HDL: 58.9 mg/dL (ref >39.00)
LDL Cholesterol: 56 mg/dL (ref 0–99)
NonHDL: 73.16
Total CHOL/HDL Ratio: 2
Triglycerides: 84 mg/dL (ref 0.0–149.0)
VLDL: 16.8 mg/dL (ref 0.0–40.0)

## 2023-05-27 LAB — URINALYSIS, ROUTINE W REFLEX MICROSCOPIC
Ketones, ur: NEGATIVE
Nitrite: NEGATIVE
Specific Gravity, Urine: >=1.03 — AB (ref 1.000–1.030)
Urine Glucose: NEGATIVE
Urobilinogen, UA: 0.2 (ref 0.0–1.0)
pH: 6 (ref 5.0–8.0)

## 2023-05-27 LAB — HEPATIC FUNCTION PANEL
ALT: 16 U/L (ref 0–35)
AST: 15 U/L (ref 0–37)
Albumin: 4.1 g/dL (ref 3.5–5.2)
Alkaline Phosphatase: 115 U/L (ref 39–117)
Bilirubin, Direct: 0.1 mg/dL (ref 0.0–0.3)
Total Bilirubin: 0.6 mg/dL (ref 0.2–1.2)
Total Protein: 6.6 g/dL (ref 6.0–8.3)

## 2023-05-27 LAB — MICROALBUMIN / CREATININE URINE RATIO
Creatinine,U: 347.4 mg/dL
Microalb Creat Ratio: 7.3 mg/g (ref 0.0–30.0)
Microalb, Ur: 2.5 mg/dL — ABNORMAL HIGH (ref 0.0–1.9)

## 2023-05-27 LAB — CBC WITH DIFFERENTIAL/PLATELET
Basophils Absolute: 0 10*3/uL (ref 0.0–0.1)
Basophils Relative: 0.3 % (ref 0.0–3.0)
Eosinophils Absolute: 0.1 10*3/uL (ref 0.0–0.7)
Eosinophils Relative: 2.3 % (ref 0.0–5.0)
HCT: 41.7 % (ref 36.0–46.0)
Hemoglobin: 14 g/dL (ref 12.0–15.0)
Lymphocytes Relative: 40.8 % (ref 12.0–46.0)
Lymphs Abs: 2.5 10*3/uL (ref 0.7–4.0)
MCHC: 33.7 g/dL (ref 30.0–36.0)
MCV: 90.4 fl (ref 78.0–100.0)
Monocytes Absolute: 0.5 10*3/uL (ref 0.1–1.0)
Monocytes Relative: 7.4 % (ref 3.0–12.0)
Neutro Abs: 3 10*3/uL (ref 1.4–7.7)
Neutrophils Relative %: 49.2 % (ref 43.0–77.0)
Platelets: 286 10*3/uL (ref 150.0–400.0)
RBC: 4.61 Mil/uL (ref 3.87–5.11)
RDW: 14.2 % (ref 11.5–15.5)
WBC: 6.2 10*3/uL (ref 4.0–10.5)

## 2023-05-27 LAB — HEMOGLOBIN A1C: Hgb A1c MFr Bld: 5.9 % (ref 4.6–6.5)

## 2023-05-27 LAB — BASIC METABOLIC PANEL WITH GFR
BUN: 15 mg/dL (ref 6–23)
CO2: 28 meq/L (ref 19–32)
Calcium: 8.9 mg/dL (ref 8.4–10.5)
Chloride: 104 meq/L (ref 96–112)
Creatinine, Ser: 0.69 mg/dL (ref 0.40–1.20)
GFR: 91.52 mL/min (ref >60.00)
Glucose, Bld: 91 mg/dL (ref 70–99)
Potassium: 3.8 meq/L (ref 3.5–5.1)
Sodium: 142 meq/L (ref 135–145)

## 2023-05-27 LAB — TSH: TSH: 3.89 u[IU]/mL (ref 0.35–5.50)

## 2023-05-27 LAB — VITAMIN B12: Vitamin B-12: 540 pg/mL (ref 211–911)

## 2023-05-29 ENCOUNTER — Ambulatory Visit: Payer: 59 | Admitting: Internal Medicine

## 2023-05-29 ENCOUNTER — Encounter: Payer: Self-pay | Admitting: Internal Medicine

## 2023-05-29 VITALS — BP 122/76 | HR 73 | Temp 98.3°F | Ht 62.0 in | Wt 198.0 lb

## 2023-05-29 DIAGNOSIS — E7800 Pure hypercholesterolemia, unspecified: Secondary | ICD-10-CM

## 2023-05-29 DIAGNOSIS — E669 Obesity, unspecified: Secondary | ICD-10-CM | POA: Diagnosis not present

## 2023-05-29 DIAGNOSIS — R300 Dysuria: Secondary | ICD-10-CM

## 2023-05-29 DIAGNOSIS — E559 Vitamin D deficiency, unspecified: Secondary | ICD-10-CM

## 2023-05-29 DIAGNOSIS — Z0000 Encounter for general adult medical examination without abnormal findings: Secondary | ICD-10-CM | POA: Diagnosis not present

## 2023-05-29 DIAGNOSIS — Z0001 Encounter for general adult medical examination with abnormal findings: Secondary | ICD-10-CM

## 2023-05-29 DIAGNOSIS — I10 Essential (primary) hypertension: Secondary | ICD-10-CM

## 2023-05-29 MED ORDER — PHENTERMINE HCL 30 MG PO CAPS: 30.0000 mg | ORAL_CAPSULE | ORAL | 5 refills | Status: DC

## 2023-05-29 MED ORDER — CEPHALEXIN 500 MG PO CAPS: 500.0000 mg | ORAL_CAPSULE | Freq: Three times a day (TID) | ORAL | 0 refills | Status: DC

## 2023-05-29 NOTE — Progress Notes (Signed)
 Patient ID: [name] [name], female   DOB: 1960-09-02, 63 y.o.   MRN: [id number]         Chief Complaint:: wellness exam and obesity, dysuria, low vit d, htn, hld       HPI:  [name] [name] is a 63 y.o. female here for wellness exam; declines GYN referral, for prevnar at pharmacy, o/w up to date                        Also hard to lose wt with diet and exercise,  Denies urinary symptoms such as frequency, urgency, flank pain, hematuria or n/v, fever, chills, but has new dysuria for 3 days.  Pt denies chest pain, increased sob or doe, wheezing, orthopnea, PND, increased LE swelling, palpitations, dizziness or syncope.   Pt denies polydipsia, polyuria, or new focal neuro s/s.   .   Wt Readings from Last 3 Encounters:  05/29/23 198 lb (89.8 kg)  11/28/22 197 lb (89.4 kg)  08/23/22 196 lb (88.9 kg)   BP Readings from Last 3 Encounters:  05/31/23 121/87  05/29/23 122/76  11/28/22 124/80   Immunization History  Administered Date(s) Administered   Influenza Split 01/29/2011, 02/26/2012   Influenza Whole 12/29/2007, 11/25/2008   Influenza,inj,Quad PF,6+ Mos 12/08/2013, 12/15/2014, 01/09/2018, 12/08/2018, 01/10/2021   Influenza-Unspecified 11/25/2016   Moderna Sars-Covid-2 Vaccination 04/29/2019, 06/01/2019, 01/07/2020   Td 02/05/2008   Td (Adult), 2 Lf Tetanus Toxid, Preservative Free 02/05/2008   Tdap 12/08/2018, 05/31/2023   Zoster Recombinant(Shingrix) 08/09/2020, 01/10/2021   Health Maintenance Due  Topic Date Due   Pneumococcal Vaccine 77-50 Years old (1 of 2 - PCV) Never done   Cervical Cancer Screening (HPV/Pap Cotest)  08/25/2012      Past Medical History:  Diagnosis Date   ANEMIA-NOS 12/08/2006   BACK PAIN 12/08/2006   BREAST HYPERTROPHY 02/05/2008   DIVERTICULOSIS, COLON 04/18/2010   GERD 12/08/2006   Heart murmur    HYPERLIPIDEMIA 12/08/2006   HYPERTENSION 12/08/2006   HYPOTHYROIDISM 02/05/2008   LIPOMA 04/18/2010   Mild mitral regurgitation by prior  echocardiogram 02/07/2011   OVERWEIGHT/OBESITY 01/11/2010   Shortness of breath 01/12/2009   SHOULDER PAIN, RIGHT 03/09/2007   URI 11/23/2009   URINALYSIS, ABNORMAL 11/23/2009   Past Surgical History:  Procedure Laterality Date   BREAST REDUCTION SURGERY  2012   CESAREAN SECTION     COLONOSCOPY     TONSILLECTOMY AND ADENOIDECTOMY      reports that she has never smoked. She has never used smokeless tobacco. She reports current alcohol use. She reports that she does not use drugs. family history includes Cancer in her cousin and another family member; Diabetes in her mother; Hypertension in her brother, mother, and sister; Stroke (age of onset: 45) in her mother. Allergies  Allergen Reactions   Propoxyphene Hcl     *DARVOCET*    REACTION: nausea   Current Outpatient Medications on File Prior to Visit  Medication Sig Dispense Refill   aspirin 81 MG EC tablet Take 1 tablet (81 mg total) by mouth daily. Swallow whole. 30 tablet 12   guaiFENesin (MUCINEX) 600 MG 12 hr tablet Take 2 tablets (1,200 mg total) by mouth 2 (two) times daily as needed. 60 tablet 1   hydrocortisone 2.5 % cream Apply topically 2 (two) times daily.     hydroquinone 4 % cream Apply topically at bedtime.     levothyroxine (SYNTHROID) 50 MCG tablet TAKE 1 TABLET BY MOUTH EVERY DAY 90  tablet 3   losartan-hydrochlorothiazide (HYZAAR) 50-12.5 MG tablet TAKE 1 TABLET BY MOUTH EVERY DAY 90 tablet 3   meloxicam (MOBIC) 7.5 MG tablet Take 1 tablet (7.5 mg total) by mouth daily. 90 tablet 2   pimecrolimus (ELIDEL) 1 % cream Apply topically daily.     potassium chloride (KLOR-CON M10) 10 MEQ tablet TAKE 1 TABLET BY MOUTH EVERY DAY 90 tablet 2   predniSONE (DELTASONE) 10 MG tablet 3 tabs by mouth per day for 3 days,2tabs per day for 3 days,1tab per day for 3 days 18 tablet 0   rosuvastatin (CRESTOR) 20 MG tablet TAKE 1 TABLET BY MOUTH EVERY DAY 90 tablet 3   Semaglutide-Weight Management (WEGOVY) 0.25 MG/0.5ML SOAJ Inject 0.25 mg  into the skin once a week. 2 mL 11   tirzepatide (MOUNJARO) 2.5 MG/0.5ML Pen Inject 2.5 mg into the skin once a week. 2 mL 11   triamcinolone (NASACORT) 55 MCG/ACT AERO nasal inhaler Place 2 sprays into the nose daily. INSTILL 2 SPRAYS INTO NOSTRIL DAILY Strength: 55 MCG/ACT 1 each 3   triamcinolone cream (KENALOG) 0.1 % Apply topically.     No current facility-administered medications on file prior to visit.        ROS:  All others reviewed and negative.  Objective        PE:  BP 122/76 (BP Location: Right Arm, Patient Position: Sitting, Cuff Size: Normal)   Pulse 73   Temp 98.3 F (36.8 C) (Oral)   Ht 5\' 2"  (1.575 m)   Wt 198 lb (89.8 kg)   LMP  (LMP Unknown)   SpO2 99%   BMI 36.21 kg/m                 Constitutional: Pt appears in NAD               HENT: Head: NCAT.                Right Ear: External ear normal.                 Left Ear: External ear normal.                Eyes: . Pupils are equal, round, and reactive to light. Conjunctivae and EOM are normal               Nose: without d/c or deformity               Neck: Neck supple. Gross normal ROM               Cardiovascular: Normal rate and regular rhythm.                 Pulmonary/Chest: Effort normal and breath sounds without rales or wheezing.                Abd:  Soft, NT, ND, + BS, no organomegaly               Neurological: Pt is alert. At baseline orientation, motor grossly intact               Skin: Skin is warm. No rashes, no other new lesions, LE edema - none               Psychiatric: Pt behavior is normal without agitation   Micro: none  Cardiac tracings I have personally interpreted today:  none  Pertinent Radiological findings (summarize): none   Lab Results  Component  Value Date   WBC 6.2 05/27/2023   HGB 14.0 05/27/2023   HCT 41.7 05/27/2023   PLT 286.0 05/27/2023   GLUCOSE 91 05/27/2023   CHOL 132 05/27/2023   TRIG 84.0 05/27/2023   HDL 58.90 05/27/2023   LDLCALC 56 05/27/2023   ALT 16  05/27/2023   AST 15 05/27/2023   NA 142 05/27/2023   K 3.8 05/27/2023   CL 104 05/27/2023   CREATININE 0.69 05/27/2023   BUN 15 05/27/2023   CO2 28 05/27/2023   TSH 3.89 05/27/2023   HGBA1C 5.9 05/27/2023   MICROALBUR 2.5 (H) 05/27/2023   Assessment/Plan:  EMELINE SIMPSON is a 63 y.o. Black or African American [2] female with  has a past medical history of ANEMIA-NOS (12/08/2006), BACK PAIN (12/08/2006), BREAST HYPERTROPHY (02/05/2008), DIVERTICULOSIS, COLON (04/18/2010), GERD (12/08/2006), Heart murmur, HYPERLIPIDEMIA (12/08/2006), HYPERTENSION (12/08/2006), HYPOTHYROIDISM (02/05/2008), LIPOMA (04/18/2010), Mild mitral regurgitation by prior echocardiogram (02/07/2011), OVERWEIGHT/OBESITY (01/11/2010), Shortness of breath (01/12/2009), SHOULDER PAIN, RIGHT (03/09/2007), URI (11/23/2009), and URINALYSIS, ABNORMAL (11/23/2009).  Encounter for well adult exam with abnormal findings Age and sex appropriate education and counseling updated with regular exercise and diet Referrals for preventative services - declines gyn Immunizations addressed - for prevnar at pharmacy Smoking counseling  - none needed Evidence for depression or other mood disorder - none significant Most recent labs reviewed. I have personally reviewed and have noted: 1) the patient's medical and social history 2) The patient's current medications and supplements 3) The patient's height, weight, and BMI have been recorded in the chart   Hyperlipidemia Lab Results  Component Value Date   LDLCALC 56 05/27/2023   Stable, pt to continue current statin crestor 20 qd   Essential hypertension BP Readings from Last 3 Encounters:  05/31/23 121/87  05/29/23 122/76  11/28/22 124/80   Stable, pt to continue medical treatment hyzaar 50 12.5 qd   Dysuria Mild to mod, likely uti, for antibx course levaquin 500 every day,,  to f/u any worsening symptoms or concerns  Vitamin D deficiency Last vitamin D Lab Results   Component Value Date   VD25OH 20.38 (L) 05/27/2023   Low, to start oral replacement   Obesity (BMI 35.0-39.9 without comorbidity) Mild to mod, for phentermine 30 mg every day,  to f/u any worsening symptoms or concerns  Followup: Return in about 6 months (around 11/28/2023).  Oliver Barre, MD 05/31/2023 7:06 PM [facility]

## 2023-05-29 NOTE — Patient Instructions (Addendum)
 Please take all new medication as prescribed  - the phentermine 30 mg per day, the antibiotic, and Please take OTC Vitamin D3 at 2000 units per day, indefinitely  Please continue all other medications as before, and refills have been done if requested.  Please have the pharmacy call with any other refills you may need.  Please continue your efforts at being more active, low cholesterol diet, and weight control.  You are otherwise up to date with prevention measures today.  Please keep your appointments with your specialists as you may have planned  Please make an Appointment to return in 6 months, or sooner if needed

## 2023-05-30 ENCOUNTER — Encounter: Payer: Self-pay | Admitting: Internal Medicine

## 2023-05-31 ENCOUNTER — Ambulatory Visit (INDEPENDENT_AMBULATORY_CARE_PROVIDER_SITE_OTHER)

## 2023-05-31 ENCOUNTER — Encounter: Payer: Self-pay | Admitting: Internal Medicine

## 2023-05-31 ENCOUNTER — Ambulatory Visit
Admission: EM | Admit: 2023-05-31 | Discharge: 2023-05-31 | Disposition: A | Discharge status: Home / Self Care | Attending: Family Medicine | Admitting: Family Medicine

## 2023-05-31 DIAGNOSIS — S91332A Puncture wound without foreign body, left foot, initial encounter: Secondary | ICD-10-CM

## 2023-05-31 DIAGNOSIS — Z23 Encounter for immunization: Secondary | ICD-10-CM | POA: Diagnosis not present

## 2023-05-31 MED ORDER — TETANUS-DIPHTH-ACELL PERTUSSIS 5-2.5-18.5 LF-MCG/0.5 IM SUSY
0.5000 mL | PREFILLED_SYRINGE | Freq: Once | INTRAMUSCULAR | Status: AC
  Administered 2023-05-31: 0.5 mL via INTRAMUSCULAR

## 2023-05-31 MED ORDER — LEVOFLOXACIN 750 MG PO TABS
750.0000 mg | ORAL_TABLET | Freq: Every day | ORAL | 0 refills | Status: AC
Start: 2023-05-31 — End: 2023-06-05

## 2023-05-31 MED ORDER — LEVOFLOXACIN 750 MG PO TABS: 750.0000 mg | ORAL_TABLET | Freq: Every day | ORAL | 0 refills | Status: DC

## 2023-05-31 NOTE — ED Provider Notes (Signed)
 [facility]    CSN: [id number] Arrival date & time: 05/31/23  1255      History   Chief Complaint Chief Complaint  Patient presents with   Foot Injury    HPI [name] [name] is a 63 y.o. female presents for a puncture wound to her foot.  Patient reports 1 hour prior to arrival she was working in her yard.  States they are also working on her deck and she stepped on a rusty nail puncturing her heel of her left foot.  Nail did go through her shoe.  She did not perform any wound care to the area after injury.  She is not up-to-date on her tetanus.  No other injuries or concerns at this time.   Foot Injury   Past Medical History:  Diagnosis Date   ANEMIA-NOS 12/08/2006   BACK PAIN 12/08/2006   BREAST HYPERTROPHY 02/05/2008   DIVERTICULOSIS, COLON 04/18/2010   GERD 12/08/2006   Heart murmur    HYPERLIPIDEMIA 12/08/2006   HYPERTENSION 12/08/2006   HYPOTHYROIDISM 02/05/2008   LIPOMA 04/18/2010   Mild mitral regurgitation by prior echocardiogram 02/07/2011   OVERWEIGHT/OBESITY 01/11/2010   Shortness of breath 01/12/2009   SHOULDER PAIN, RIGHT 03/09/2007   URI 11/23/2009   URINALYSIS, ABNORMAL 11/23/2009    Patient Active Problem List   Diagnosis Date Noted   Obesity (BMI 35.0-39.9 without comorbidity) 05/29/2023   Microhematuria 12/01/2022   Cough 12/01/2022   Wheezing 12/01/2022   Acute sinusitis 08/23/2022   Bilateral hand pain 02/10/2022   Right ankle pain 02/10/2022   Abnormal weight gain 09/21/2021   Menopause 09/21/2021   Other specified abnormal findings of blood chemistry 09/21/2021   Other specified abnormal immunological findings in serum 09/21/2021   Reduced libido 09/21/2021   Neuritis of left ulnar nerve 06/22/2021   Mass of foot 09/11/2020   Foot pain, right 08/13/2020   Vitamin D deficiency 08/13/2020   Abnormal cervical Papanicolaou smear 08/09/2020   Disorder of breast 08/09/2020   Ectopic pregnancy 08/09/2020   Obesity 08/09/2020    Urinary tract infectious disease 08/09/2020   Uterine leiomyoma 08/09/2020   Hyperpigmentation 06/02/2019   Lichen sclerosus et atrophicus 06/02/2019   Melasma 06/02/2019   Hirsutism 05/01/2018   Multiple contusions 02/17/2018   Left hip pain 02/17/2018   Right leg pain 02/17/2018   Hypersomnolence 09/30/2017   Posterior neck pain 03/07/2017   Left wrist pain 03/07/2017   Hyperglycemia 01/01/2017   Anxiety 06/21/2016   Grief reaction 12/22/2015   IBS (irritable bowel syndrome) 12/22/2015   Left-sided low back pain without sciatica 12/17/2014   AP (abdominal pain) 12/17/2014   Acute upper back pain 10/27/2014   Mass of face 10/17/2014   Dermatochalasis of left upper eyelid 05/09/2014   Dermatochalasis of right upper eyelid 05/09/2014   Cellulitis of back 05/29/2012   Other malaise and fatigue 05/29/2012   Fatigue 05/29/2012   Dysuria 02/18/2012   Amenorrhea 02/18/2012   Allergic rhinitis 02/18/2012   Mild mitral regurgitation by prior echocardiogram 02/07/2011   TIA (transient ischemic attack) 01/29/2011   Hypokalemia 01/29/2011   Encounter for well adult exam with abnormal findings 08/17/2010   Lipoma 04/18/2010   Diverticulosis of colon 04/18/2010   ANKLE PAIN, RIGHT 04/18/2010   Overweight 01/11/2010   Shortness of breath 01/12/2009   Chest pain 01/12/2009   Disorder of thyroid gland 02/05/2008   BREAST HYPERTROPHY 02/05/2008   Right shoulder pain 03/09/2007   Hyperlipidemia 12/08/2006   ANEMIA-NOS 12/08/2006  Essential hypertension 12/08/2006   GERD 12/08/2006   Right sided sciatica 12/08/2006   Backache 12/08/2006    Past Surgical History:  Procedure Laterality Date   BREAST REDUCTION SURGERY  2012   CESAREAN SECTION     COLONOSCOPY     TONSILLECTOMY AND ADENOIDECTOMY      OB History   No obstetric history on file.      Home Medications    Prior to Admission medications   Medication Sig Start Date End Date Taking? Authorizing Provider   levofloxacin (LEVAQUIN) 750 MG tablet Take 1 tablet (750 mg total) by mouth daily for 5 days. 05/31/23 06/05/23 Yes Radford Pax, NP  levothyroxine (SYNTHROID) 50 MCG tablet TAKE 1 TABLET BY MOUTH EVERY DAY 10/07/22  Yes Corwin Levins, MD  losartan-hydrochlorothiazide [facility]) 50-12.5 MG tablet TAKE 1 TABLET BY MOUTH EVERY DAY 10/24/22  Yes Corwin Levins, MD  potassium chloride (KLOR-CON M10) 10 MEQ tablet TAKE 1 TABLET BY MOUTH EVERY DAY 09/30/22  Yes Corwin Levins, MD  rosuvastatin (CRESTOR) 20 MG tablet TAKE 1 TABLET BY MOUTH EVERY DAY 10/24/22  Yes Corwin Levins, MD  aspirin 81 MG EC tablet Take 1 tablet (81 mg total) by mouth daily. Swallow whole. 01/30/12   Corwin Levins, MD  guaiFENesin (MUCINEX) 600 MG 12 hr tablet Take 2 tablets (1,200 mg total) by mouth 2 (two) times daily as needed. 08/23/22   Corwin Levins, MD  hydrocortisone 2.5 % cream Apply topically 2 (two) times daily. 07/23/20   [provider]  hydroquinone 4 % cream Apply topically at bedtime. 06/19/20   [provider]  meloxicam (MOBIC) 7.5 MG tablet Take 1 tablet (7.5 mg total) by mouth daily. 06/22/21   Corwin Levins, MD  phentermine 30 MG capsule Take 1 capsule (30 mg total) by mouth every morning. 05/29/23   Corwin Levins, MD  pimecrolimus (ELIDEL) 1 % cream Apply topically daily. 07/17/20   [provider]  predniSONE (DELTASONE) 10 MG tablet 3 tabs by mouth per day for 3 days,2tabs per day for 3 days,1tab per day for 3 days 11/28/22   Corwin Levins, MD  Semaglutide-Weight Management (WEGOVY) 0.25 MG/0.5ML SOAJ Inject 0.25 mg into the skin once a week. 02/08/22   Corwin Levins, MD  tirzepatide [facility]) 2.5 MG/0.5ML Pen Inject 2.5 mg into the skin once a week. 01/30/22   Corwin Levins, MD  triamcinolone (NASACORT) 55 MCG/ACT AERO nasal inhaler Place 2 sprays into the nose daily. INSTILL 2 SPRAYS INTO NOSTRIL DAILY Strength: 55 MCG/ACT 05/23/21   Corwin Levins, MD  triamcinolone cream (KENALOG) 0.1 % Apply  topically. 06/19/20   [provider]    Family History Family History  Problem Relation Age of Onset   Cancer Other        breast cancer   Diabetes Mother    Stroke Mother 59       Alive age 39   Hypertension Mother    Hypertension Sister    Hypertension Brother    Cancer Cousin        breast cancer   Colon cancer Neg Hx    Esophageal cancer Neg Hx    Rectal cancer Neg Hx    Stomach cancer Neg Hx     Social History Social History   Tobacco Use   Smoking status: Never   Smokeless tobacco: Never  Vaping Use   Vaping status: Never Used  Substance Use Topics  Alcohol use: Yes    Comment: rare   Drug use: No     Allergies   Propoxyphene hcl   Review of Systems Review of Systems  Skin:  Positive for wound.     Physical Exam Triage Vital Signs ED Triage Vitals  Encounter Vitals Group     BP 05/31/23 1304 121/87     Systolic BP Percentile --      Diastolic BP Percentile --      Pulse Rate 05/31/23 1304 85     Resp 05/31/23 1304 16     Temp 05/31/23 1304 98.1 F (36.7 C)     Temp Source 05/31/23 1304 Oral     SpO2 05/31/23 1304 97 %     Weight --      Height --      Head Circumference --      Peak Flow --      Pain Score 05/31/23 1303 0     Pain Loc --      Pain Education --      Exclude from Growth Chart --    No data found.  Updated Vital Signs BP 121/87 (BP Location: Right Arm)   Pulse 85   Temp 98.1 F (36.7 C) (Oral)   Resp 16   LMP  (LMP Unknown)   SpO2 97%   Visual Acuity Right Eye Distance:   Left Eye Distance:   Bilateral Distance:    Right Eye Near:   Left Eye Near:    Bilateral Near:     Physical Exam Vitals and nursing note reviewed.  Constitutional:      General: She is not in acute distress.    Appearance: Normal appearance. She is not ill-appearing.  HENT:     Head: Normocephalic and atraumatic.  Eyes:     Pupils: Pupils are equal, round, and reactive to light.  Cardiovascular:     Rate and Rhythm:  Normal rate.  Pulmonary:     Effort: Pulmonary effort is normal.  Musculoskeletal:       Feet:  Feet:     Comments: There is a single puncture wound to the plantar aspect of the left heel.  No active bleeding.  No visible or palpable foreign bodies.  Area is mildly tender to palpation. Skin:    General: Skin is warm and dry.  Neurological:     General: No focal deficit present.     Mental Status: She is alert and oriented to person, place, and time.  Psychiatric:        Mood and Affect: Mood normal.        Behavior: Behavior normal.      UC Treatments / Results  Labs (all labs ordered are listed, but only abnormal results are displayed) Labs Reviewed - No data to display  EKG   Radiology No results found.  Procedures Procedures (including critical care time)  Medications Ordered in UC Medications  Tdap (BOOSTRIX) injection 0.5 mL (0.5 mLs Intramuscular Given 05/31/23 1308)    Initial Impression / Assessment and Plan / UC Course  I have reviewed the triage vital signs and the nursing notes.  Pertinent labs & imaging results that were available during my care of the patient were reviewed by me and considered in my medical decision making (see chart for details).     Pt Tdap updated in clinic.  Wound was cleansed thoroughly and dressed by nursing staff.  X-ray negative for foreign body.  His puncture wound did  go through she will start Levaquin daily for 5 days.  Side effect profile reviewed.  She did mention that her PCP had sent her and Keflex for a UTI which she has not yet picked up.  Advised her to let her PCP know that she is going to be on Levaquin as this will also cover UTIs.  Signs and symptoms for infection/red flags/ER precautions reviewed and patient verbalized understanding.  Patient to follow-up with PCP in 2 to 3 days for recheck. Final Clinical Impressions(s) / UC Diagnoses   Final diagnoses:  Puncture wound of left foot, initial encounter      Discharge Instructions      Keep the wound clean and dry. Start levaquin daily for 5 days. Please inform your PCP you are on this for the puncture wound as they had sent in keflex for you for a UTI.  Keep wound clean and dry and change dressing daily unless it becomes soiled or saturated.  Monitor for any signs of infection.  This includes but is not limited to redness, drainage, swelling, fevers or chills and please seek reevaluation if these occur.  Follow-up with your PCP in 2 to 3 days for recheck.  Please go to the ER for any worsening symptoms.  Hope you feel better soon!     ED Prescriptions     Medication Sig Dispense Auth. Provider   levofloxacin (LEVAQUIN) 750 MG tablet Take 1 tablet (750 mg total) by mouth daily for 5 days. 5 tablet Radford Pax, NP      PDMP not reviewed this encounter.   Radford Pax, NP 05/31/23 [phone]

## 2023-05-31 NOTE — Assessment & Plan Note (Signed)
 Mild to mod, for phentermine 30 mg every day, to f/u any worsening symptoms or concerns

## 2023-05-31 NOTE — Assessment & Plan Note (Signed)
 Mild to mod, likely uti, for antibx course levaquin 500 every day,,  to f/u any worsening symptoms or concerns

## 2023-05-31 NOTE — Assessment & Plan Note (Signed)
 BP Readings from Last 3 Encounters:  05/31/23 121/87  05/29/23 122/76  11/28/22 124/80   Stable, pt to continue medical treatment hyzaar 50 12.5 qd

## 2023-05-31 NOTE — Assessment & Plan Note (Signed)
 Last vitamin D Lab Results  Component Value Date   VD25OH 20.38 (L) 05/27/2023   Low, to start oral replacement

## 2023-05-31 NOTE — Assessment & Plan Note (Signed)
 Lab Results  Component Value Date   LDLCALC 56 05/27/2023   Stable, pt to continue current statin crestor 20 qd

## 2023-05-31 NOTE — Discharge Instructions (Addendum)
 Keep the wound clean and dry. Start levaquin daily for 5 days. Please inform your PCP you are on this for the puncture wound as they had sent in keflex for you for a UTI.  Keep wound clean and dry and change dressing daily unless it becomes soiled or saturated.  Monitor for any signs of infection.  This includes but is not limited to redness, drainage, swelling, fevers or chills and please seek reevaluation if these occur.  Follow-up with your PCP in 2 to 3 days for recheck.  Please go to the ER for any worsening symptoms.  Hope you feel better soon!

## 2023-05-31 NOTE — Assessment & Plan Note (Signed)
 Age and sex appropriate education and counseling updated with regular exercise and diet Referrals for preventative services - declines gyn Immunizations addressed - for prevnar at pharmacy Smoking counseling  - none needed Evidence for depression or other mood disorder - none significant Most recent labs reviewed. I have personally reviewed and have noted: 1) the patient's medical and social history 2) The patient's current medications and supplements 3) The patient's height, weight, and BMI have been recorded in the chart

## 2023-05-31 NOTE — ED Triage Notes (Signed)
 Patient states that she stepped on a rusty nail today. Left foot. Unknown last tdap.

## 2023-06-02 ENCOUNTER — Telehealth: Payer: Self-pay

## 2023-06-02 ENCOUNTER — Other Ambulatory Visit (HOSPITAL_COMMUNITY): Payer: Self-pay

## 2023-06-02 NOTE — Telephone Encounter (Signed)
 Pharmacy Patient Advocate Encounter   Received notification from CoverMyMeds that prior authorization for Phentermine HCl 30MG  capsules is required/requested.   Insurance verification completed.   The patient is insured through [facility] .   Per test claim: PA required; PA submitted to above mentioned insurance via CoverMyMeds Key/confirmation #/EOC [id number] Status is pending

## 2023-06-02 NOTE — Telephone Encounter (Signed)
 Pharmacy Patient Advocate Encounter  Received notification from [facility] that Prior Authorization for Phentermine HCl 30MG  capsules has been APPROVED from 06/02/23 to 09/01/23. Ran test claim, Copay is $1.32. This test claim was processed through [facility]- copay amounts may vary at other pharmacies due to pharmacy/plan contracts, or as the patient moves through the different stages of their insurance plan.   PA #/Case ID/Reference #: [id number]

## 2023-10-17 ENCOUNTER — Other Ambulatory Visit: Payer: Self-pay | Admitting: Internal Medicine

## 2023-10-17 DIAGNOSIS — E7800 Pure hypercholesterolemia, unspecified: Secondary | ICD-10-CM

## 2023-10-17 DIAGNOSIS — I10 Essential (primary) hypertension: Secondary | ICD-10-CM

## 2023-11-28 ENCOUNTER — Other Ambulatory Visit: Payer: Self-pay | Admitting: Internal Medicine

## 2023-11-28 ENCOUNTER — Other Ambulatory Visit: Payer: Self-pay

## 2023-11-28 LAB — HM MAMMOGRAPHY

## 2023-12-01 ENCOUNTER — Encounter: Payer: Self-pay | Admitting: Internal Medicine

## 2024-01-24 ENCOUNTER — Encounter (HOSPITAL_COMMUNITY): Payer: Self-pay | Admitting: Emergency Medicine

## 2024-01-24 ENCOUNTER — Other Ambulatory Visit: Payer: Self-pay

## 2024-01-24 ENCOUNTER — Telehealth (HOSPITAL_COMMUNITY): Payer: Self-pay | Admitting: Family Medicine

## 2024-01-24 ENCOUNTER — Ambulatory Visit (HOSPITAL_COMMUNITY)

## 2024-01-24 ENCOUNTER — Ambulatory Visit (HOSPITAL_COMMUNITY)
Admission: EM | Admit: 2024-01-24 | Discharge: 2024-01-24 | Disposition: A | Discharge status: Home / Self Care | Attending: Family Medicine | Admitting: Family Medicine

## 2024-01-24 DIAGNOSIS — M79604 Pain in right leg: Secondary | ICD-10-CM

## 2024-01-24 MED ORDER — KETOROLAC TROMETHAMINE 30 MG/ML IJ SOLN
INTRAMUSCULAR | Status: AC
  Filled 2024-01-24: qty 1

## 2024-01-24 MED ORDER — CYCLOBENZAPRINE HCL 5 MG PO TABS: 5.0000 mg | ORAL_TABLET | Freq: Two times a day (BID) | ORAL | 0 refills | Status: AC | PRN

## 2024-01-24 MED ORDER — KETOROLAC TROMETHAMINE 30 MG/ML IJ SOLN
15.0000 mg | Freq: Once | INTRAMUSCULAR | Status: AC
  Administered 2024-01-24: 15 mg via INTRAMUSCULAR

## 2024-01-24 MED ORDER — PREDNISONE 20 MG PO TABS: 40.0000 mg | ORAL_TABLET | Freq: Every day | ORAL | 0 refills | Status: AC

## 2024-01-24 NOTE — ED Provider Notes (Addendum)
 [facility]    CSN: [id number] Arrival date & time: 01/24/24  9066      History   Chief Complaint Chief Complaint  Patient presents with   Leg Pain    HPI [name] [name] is a 63 y.o. female.    Leg Pain  Here for pain in her right lower leg.  For about 1 week she has had pain in her right lower leg.  Initially it was around her right knee.  Now it is more her posterior lower leg around the calf and the anterior lower leg.  No trauma or fall.  She has been taking Aleve  or 2 Advil. No fever or rash   No swelling  She is allergic to Darvon.  She is not on any blood thinners.  Last eGFR in April of this year was 58   Past Medical History:  Diagnosis Date   ANEMIA-NOS 12/08/2006   BACK PAIN 12/08/2006   BREAST HYPERTROPHY 02/05/2008   DIVERTICULOSIS, COLON 04/18/2010   GERD 12/08/2006   Heart murmur    HYPERLIPIDEMIA 12/08/2006   HYPERTENSION 12/08/2006   HYPOTHYROIDISM 02/05/2008   LIPOMA 04/18/2010   Mild mitral regurgitation by prior echocardiogram 02/07/2011   OVERWEIGHT/OBESITY 01/11/2010   Shortness of breath 01/12/2009   SHOULDER PAIN, RIGHT 03/09/2007   URI 11/23/2009   URINALYSIS, ABNORMAL 11/23/2009    Patient Active Problem List   Diagnosis Date Noted   Obesity (BMI 35.0-39.9 without comorbidity) 05/29/2023   Microhematuria 12/01/2022   Cough 12/01/2022   Wheezing 12/01/2022   Acute sinusitis 08/23/2022   Bilateral hand pain 02/10/2022   Right ankle pain 02/10/2022   Abnormal weight gain 09/21/2021   Menopause 09/21/2021   Other specified abnormal findings of blood chemistry 09/21/2021   Other specified abnormal immunological findings in serum 09/21/2021   Reduced libido 09/21/2021   Neuritis of left ulnar nerve 06/22/2021   Mass of foot 09/11/2020   Foot pain, right 08/13/2020   Vitamin D  deficiency 08/13/2020   Abnormal cervical Papanicolaou smear 08/09/2020   Disorder of breast 08/09/2020   Ectopic pregnancy 08/09/2020    Obesity 08/09/2020   Urinary tract infectious disease 08/09/2020   Uterine leiomyoma 08/09/2020   Hyperpigmentation 06/02/2019   Lichen sclerosus et atrophicus 06/02/2019   Melasma 06/02/2019   Hirsutism 05/01/2018   Multiple contusions 02/17/2018   Left hip pain 02/17/2018   Right leg pain 02/17/2018   Hypersomnolence 09/30/2017   Posterior neck pain 03/07/2017   Left wrist pain 03/07/2017   Hyperglycemia 01/01/2017   Anxiety 06/21/2016   Grief reaction 12/22/2015   IBS (irritable bowel syndrome) 12/22/2015   Left-sided low back pain without sciatica 12/17/2014   AP (abdominal pain) 12/17/2014   Acute upper back pain 10/27/2014   Mass of face 10/17/2014   Dermatochalasis of left upper eyelid 05/09/2014   Dermatochalasis of right upper eyelid 05/09/2014   Cellulitis of back 05/29/2012   Other malaise and fatigue 05/29/2012   Fatigue 05/29/2012   Dysuria 02/18/2012   Amenorrhea 02/18/2012   Allergic rhinitis 02/18/2012   Mild mitral regurgitation by prior echocardiogram 02/07/2011   TIA (transient ischemic attack) 01/29/2011   Hypokalemia 01/29/2011   Encounter for well adult exam with abnormal findings 08/17/2010   Lipoma 04/18/2010   Diverticulosis of colon 04/18/2010   ANKLE PAIN, RIGHT 04/18/2010   Overweight 01/11/2010   Shortness of breath 01/12/2009   Chest pain 01/12/2009   Disorder of thyroid  gland 02/05/2008   BREAST HYPERTROPHY 02/05/2008   Right  shoulder pain 03/09/2007   Hyperlipidemia 12/08/2006   ANEMIA-NOS 12/08/2006   Essential hypertension 12/08/2006   GERD 12/08/2006   Right sided sciatica 12/08/2006   Backache 12/08/2006    Past Surgical History:  Procedure Laterality Date   BREAST REDUCTION SURGERY  2012   CESAREAN SECTION     COLONOSCOPY     TONSILLECTOMY AND ADENOIDECTOMY      OB History   No obstetric history on file.      Home Medications    Prior to Admission medications   Medication Sig Start Date End Date Taking?  Authorizing Provider  cyclobenzaprine  (FLEXERIL ) 5 MG tablet Take 1 tablet (5 mg total) by mouth 2 (two) times daily as needed for muscle spasms. 01/24/24  Yes Vonna Sharlet POUR, MD  predniSONE  (DELTASONE ) 20 MG tablet Take 2 tablets (40 mg total) by mouth daily with breakfast for 5 days. 01/24/24 01/29/24 Yes BanisterSharlet POUR, MD  aspirin  81 MG EC tablet Take 1 tablet (81 mg total) by mouth daily. Swallow whole. 01/30/12   Norleen Lynwood ORN, MD  hydrocortisone 2.5 % cream Apply topically 2 (two) times daily. 07/23/20   [provider]  hydroquinone 4 % cream Apply topically at bedtime. 06/19/20   [provider]  KLOR-CON  M10 10 MEQ tablet TAKE 1 TABLET BY MOUTH EVERY DAY 11/28/23   Norleen Lynwood ORN, MD  levothyroxine  (SYNTHROID ) 50 MCG tablet TAKE 1 TABLET BY MOUTH EVERY DAY 11/28/23   Norleen Lynwood ORN, MD  losartan -hydrochlorothiazide  (HYZAAR) 50-12.5 MG tablet TAKE 1 TABLET BY MOUTH EVERY DAY 10/17/23   Norleen Lynwood ORN, MD  pimecrolimus (ELIDEL) 1 % cream Apply topically daily. 07/17/20   [provider]  rosuvastatin  (CRESTOR ) 20 MG tablet TAKE 1 TABLET BY MOUTH EVERY DAY 10/17/23   Norleen Lynwood ORN, MD  triamcinolone  (NASACORT ) 55 MCG/ACT AERO nasal inhaler Place 2 sprays into the nose daily. INSTILL 2 SPRAYS INTO NOSTRIL DAILY Strength: 55 MCG/ACT 05/23/21   Norleen Lynwood ORN, MD  triamcinolone  cream (KENALOG ) 0.1 % Apply topically. 06/19/20   [provider]    Family History Family History  Problem Relation Age of Onset   Diabetes Mother    Stroke Mother 27       Alive age 73   Hypertension Mother    Hypertension Sister    Hypertension Brother    Cancer Cousin        breast cancer   Cancer Other        breast cancer   Colon cancer Neg Hx    Esophageal cancer Neg Hx    Rectal cancer Neg Hx    Stomach cancer Neg Hx     Social History Social History   Tobacco Use   Smoking status: Never   Smokeless tobacco: Never  Vaping Use   Vaping status: Never Used   Substance Use Topics   Alcohol use: Yes    Comment: rare   Drug use: No     Allergies   Propoxyphene hcl   Review of Systems Review of Systems   Physical Exam Triage Vital Signs ED Triage Vitals  Encounter Vitals Group     BP 01/24/24 1003 (!) 145/72     Girls Systolic BP Percentile --      Girls Diastolic BP Percentile --      Boys Systolic BP Percentile --      Boys Diastolic BP Percentile --      Pulse Rate 01/24/24 1003 75  Resp 01/24/24 1003 20     Temp 01/24/24 1003 98.1 F (36.7 C)     Temp Source 01/24/24 1003 Oral     SpO2 01/24/24 1003 96 %     Weight --      Height --      Head Circumference --      Peak Flow --      Pain Score 01/24/24 1000 7     Pain Loc --      Pain Education --      Exclude from Growth Chart --    No data found.  Updated Vital Signs BP (!) 145/72 (BP Location: Left Arm)   Pulse 75   Temp 98.1 F (36.7 C) (Oral)   Resp 20   LMP  (LMP Unknown)   SpO2 96%   Visual Acuity Right Eye Distance:   Left Eye Distance:   Bilateral Distance:    Right Eye Near:   Left Eye Near:    Bilateral Near:     Physical Exam Vitals reviewed.  Constitutional:      General: She is not in acute distress.    Appearance: She is not ill-appearing, toxic-appearing or diaphoretic.     Comments: While sitting in the exam room, facies is not pained.  No diaphoresis   HENT:     Mouth/Throat:     Mouth: Mucous membranes are moist.  Musculoskeletal:     Right lower leg: No edema.     Left lower leg: No edema.     Comments: There is no swelling of either leg.  Pulses are 2+ and equal on both the DP and PT areas.  There is no tenderness of the knee and no effusion of the knee.  There is no erythema of the right leg or the left leg.  There is no calf tenderness bilaterally.  [place] is negative bilaterally.    Skin:    Coloration: Skin is not jaundiced or pale.  Neurological:     General: No focal deficit present.     Mental Status: She is  alert and oriented to person, place, and time.  Psychiatric:        Behavior: Behavior normal.      UC Treatments / Results  Labs (all labs ordered are listed, but only abnormal results are displayed) Labs Reviewed - No data to display  EKG   Radiology No results found.  Procedures Procedures (including critical care time)  Medications Ordered in UC Medications  ketorolac  (TORADOL ) 30 MG/ML injection 15 mg (has no administration in time range)    Initial Impression / Assessment and Plan / UC Course  I have reviewed the triage vital signs and the nursing notes.  Pertinent labs & imaging results that were available during my care of the patient were reviewed by me and considered in my medical decision making (see chart for details).     By my review there are no acute bony abnormalities on the x-rays of her knee and tib-fib.  There are some signs of arthritis and she does have some calcifications in the posterior knee.  Prednisone  is sent in for 5 days and Toradol  injection and a low dose is given here.  If it is not clear that arthritis or bony pain is causing her symptoms, venous Doppler is still ordered for completeness sake. Final Clinical Impressions(s) / UC Diagnoses   Final diagnoses:  Right leg pain     Discharge Instructions  By my review, there are some signs of arthritis on your knee x-ray and some calcifications in the posterior knee.  There are no signs of any bony abnormality that really explain your symptoms.  The radiologist will also read your x-ray, and if their interpretation differs significantly from mine, and the management of your condition would change, we will call you.  You have been given a shot of Toradol  30 mg today.  Take prednisone  20 mg--2 daily for 5 days  Cyclobenzaprine  5 mg--1 tablet 2 times daily as needed for muscle spasm or muscle pain.  This medication can make you dizzy or sleepy  The vascular lab will notify us  of  your results.  If you worsen in any way, or if the treatments provided do not provide you any relief, please consider going to the emergency room for further evaluation.     ED Prescriptions     Medication Sig Dispense Auth. Provider   predniSONE  (DELTASONE ) 20 MG tablet Take 2 tablets (40 mg total) by mouth daily with breakfast for 5 days. 10 tablet Vonna Sharlet POUR, MD   cyclobenzaprine  (FLEXERIL ) 5 MG tablet Take 1 tablet (5 mg total) by mouth 2 (two) times daily as needed for muscle spasms. 10 tablet Vonna Raeley Gilmore K, MD      PDMP not reviewed this encounter.   Vonna Sharlet POUR, MD 01/24/24 1058    Vonna Sharlet POUR, MD 01/24/24 1058

## 2024-01-24 NOTE — ED Notes (Signed)
 Patient instructed to get undressed for provider examination

## 2024-01-24 NOTE — Discharge Instructions (Addendum)
 By my review, there are some signs of arthritis on your knee x-ray and some calcifications in the posterior knee.  There are no signs of any bony abnormality that really explain your symptoms.  The radiologist will also read your x-ray, and if their interpretation differs significantly from mine, and the management of your condition would change, we will call you.  You have been given a shot of Toradol  30 mg today.  Take prednisone  20 mg--2 daily for 5 days  Cyclobenzaprine  5 mg--1 tablet 2 times daily as needed for muscle spasm or muscle pain.  This medication can make you dizzy or sleepy  The vascular lab will notify us  of your results.  If you worsen in any way, or if the treatments provided do not provide you any relief, please consider going to the emergency room for further evaluation.

## 2024-01-24 NOTE — Telephone Encounter (Signed)
 This morning when patient was seen for right lower leg pain, a venous Doppler was ordered.  According to our usual protocol she was sent to [facility] to check in at the reception for the ER to check in for the imaging (since this is a weekend day and we cannot schedule on a weekend at the vascular center).  The receptionist turned her away stating that that could not be done if it was not already scheduled.  We were told that the tech doing vascular testing was only available via pager or cell phone and would only do testing that was scheduled.  The receptionist was unwilling to give our staff a contact information for this person.  We were told to call central scheduling for this person to have a Doppler.  Or, we were told that the patient could check in and be seen by the ER physician/provider and then have testing done if indicated.  We have not been able since all this happened this morning to get a Doppler scheduled for this patient.  I have talked to her at about 1600 and gone over all this with her.  If she worsens anyway she will go ahead and go to the emergency room.  I have sent this to our callback nurse to schedule this Doppler for her on Monday, December 1 when central scheduling will be open.  If she is completely well and not having any symptoms by the time Monday comes, she does not have to have the study.  Alfonso, please schedule this lady her venous Doppler is ordered

## 2024-01-24 NOTE — ED Triage Notes (Signed)
 Right leg pain onset one week ago.  Worsened after thanksgiving.  Patient thought increase in pain related to having been standing for long periods of time cooking on thanksgiving  Initially knee was hurting, now calf of right leg is painful .  Pain kept patient awake last night.  Patient states it feels like pain moves and occasionally feels pain in front of lower leg.  Patient has taken aleve  and advil.  Initially, helped slightly, but the last 2 days pain has been excruciating

## 2024-01-25 ENCOUNTER — Ambulatory Visit (HOSPITAL_COMMUNITY)
Admission: RE | Admit: 2024-01-25 | Discharge: 2024-01-25 | Disposition: A | Source: Ambulatory Visit | Attending: Family Medicine

## 2024-01-25 DIAGNOSIS — M7989 Other specified soft tissue disorders: Secondary | ICD-10-CM | POA: Diagnosis present

## 2024-01-25 DIAGNOSIS — R52 Pain, unspecified: Secondary | ICD-10-CM | POA: Diagnosis not present

## 2024-01-25 NOTE — Progress Notes (Signed)
 VASCULAR LAB    Right lower extremity venous duplex has been performed.  See CV proc for preliminary results.   Philomene Haff, RVT 01/25/2024, 12:13 PM

## 2024-01-26 NOTE — Telephone Encounter (Signed)
 Called Radiology Scheduling to ensure exam had been scheduled.  It appears it was done on 11/30 at [facility], but is still in arrived status. Staff gave number for Vascular to f/u if no result in the next day or two - [phone], [phone].

## 2024-02-13 ENCOUNTER — Ambulatory Visit: Payer: Self-pay

## 2024-02-13 NOTE — Telephone Encounter (Signed)
 FYI Only or Action Required?: FYI only for provider: appointment scheduled on 02/16/2024 at 3:20 PM.  Patient was last seen in primary care on 05/29/2023 by Norleen Lynwood ORN, MD.  Called Nurse Triage reporting Leg Pain.  Symptoms began several weeks ago.  Interventions attempted: Rest, hydration, or home remedies.  Symptoms are: unchanged.  Triage Disposition: See PCP When Office is Open (Within 3 Days)  Patient/caregiver understands and will follow disposition?: Yes  Copied from CRM #[id number]. Topic: Clinical - Red Word Triage >> Feb 13, 2024 11:07 AM Rea ORN wrote: Red Word that prompted transfer to Nurse Triage: Pt has painful right leg. Was seen at UC 2 weeks ago and given rx. Pain not getting any better. Reason for Disposition  [1] MODERATE pain (e.g., interferes with normal activities, limping) AND [2] present > 3 days  Answer Assessment - Initial Assessment Questions Patient reports right leg pain that has been going on for a couple of weeks. Was given medication and had improvement in symptoms.   1. ONSET: When did the pain start?      Started a couple of weeks ago 2. LOCATION: Where is the pain located?      Right leg pain 3. PAIN: How bad is the pain?    (Scale 1-10; or mild, moderate, severe)     5 out of 10 4. WORK OR EXERCISE: Has there been any recent work or exercise that involved this part of the body?      no 5. CAUSE: What do you think is causing the leg pain?     unsure 6. OTHER SYMPTOMS: Do you have any other symptoms? (e.g., chest pain, back pain, breathing difficulty, swelling, rash, fever, numbness, weakness)     no  Protocols used: Leg Pain-A-AH

## 2024-02-16 ENCOUNTER — Encounter: Payer: Self-pay | Admitting: Internal Medicine

## 2024-02-16 ENCOUNTER — Ambulatory Visit: Admitting: Internal Medicine

## 2024-02-16 VITALS — BP 142/100 | HR 87 | Temp 98.9°F | Ht 62.0 in | Wt 197.0 lb

## 2024-02-16 DIAGNOSIS — I10 Essential (primary) hypertension: Secondary | ICD-10-CM

## 2024-02-16 DIAGNOSIS — R739 Hyperglycemia, unspecified: Secondary | ICD-10-CM | POA: Diagnosis not present

## 2024-02-16 DIAGNOSIS — E559 Vitamin D deficiency, unspecified: Secondary | ICD-10-CM | POA: Diagnosis not present

## 2024-02-16 DIAGNOSIS — M1711 Unilateral primary osteoarthritis, right knee: Secondary | ICD-10-CM

## 2024-02-16 DIAGNOSIS — M79671 Pain in right foot: Secondary | ICD-10-CM

## 2024-02-16 MED ORDER — MELOXICAM 15 MG PO TABS: 15.0000 mg | ORAL_TABLET | Freq: Every day | ORAL | 1 refills | Status: AC | PRN

## 2024-02-16 NOTE — Patient Instructions (Signed)
 Please take all new medication as prescribed - the anti- inflammatory as needed  Please continue all other medications as before, and refills have been done if requested.  Please have the pharmacy call with any other refills you may need.  Please keep your appointments with your specialists as you may have planned  You will be contacted regarding the referral for: Sports Medicine

## 2024-02-16 NOTE — Progress Notes (Unsigned)
 Patient ID: [name], female   DOB: [birth year], 63 y.o.   MRN: [id number]        Chief Complaint: follow up HTN, right knee arthritis, right foot pain, hyperglycemia, low vit d       HPI:  [name] [name] is a 63 y.o. female here with 2-3 wks worsening right knee pain and swelling, seen at [facility] nov 29, Dvt ruled out with doppler u/s, and tx with prednisone , shot in the office, and muscle relaxer.   Unfortunately, just not improved, and knee feels somewhat unstable, but no giveaways or falls.  Does also have mild right foot pain with spur noted on xray, but pt declines need for podiatry or other at this time.  BP has been controlled at home per pt.        Wt Readings from Last 3 Encounters:  02/16/24 197 lb (89.4 kg)  05/29/23 198 lb (89.8 kg)  11/28/22 197 lb (89.4 kg)   BP Readings from Last 3 Encounters:  02/16/24 (!) 142/100  01/24/24 (!) 145/72  05/31/23 121/87         Past Medical History:  Diagnosis Date   ANEMIA-NOS 12/08/2006   BACK PAIN 12/08/2006   BREAST HYPERTROPHY 02/05/2008   DIVERTICULOSIS, COLON 04/18/2010   GERD 12/08/2006   Heart murmur    HYPERLIPIDEMIA 12/08/2006   HYPERTENSION 12/08/2006   HYPOTHYROIDISM 02/05/2008   LIPOMA 04/18/2010   Mild mitral regurgitation by prior echocardiogram 02/07/2011   OVERWEIGHT/OBESITY 01/11/2010   Shortness of breath 01/12/2009   SHOULDER PAIN, RIGHT 03/09/2007   URI 11/23/2009   URINALYSIS, ABNORMAL 11/23/2009   Past Surgical History:  Procedure Laterality Date   BREAST REDUCTION SURGERY  2012   CESAREAN SECTION     COLONOSCOPY     TONSILLECTOMY AND ADENOIDECTOMY      reports that she has never smoked. She has never used smokeless tobacco. She reports current alcohol use. She reports that she does not use drugs. family history includes Cancer in her cousin and another family member; Diabetes in her mother; Hypertension in her brother, mother, and sister; Stroke (age of onset: 43) in her  mother. Allergies[1] Medications Ordered Prior to Encounter[2]      ROS:  All others reviewed and negative.  Objective        PE:  BP (!) 142/100 (BP Location: Right Arm, Patient Position: Sitting, Cuff Size: Normal)   Pulse 87   Temp 98.9 F (37.2 C) (Oral)   Ht 5' 2 (1.575 m)   Wt 197 lb (89.4 kg)   LMP  (LMP Unknown)   SpO2 97%   BMI 36.03 kg/m                 Constitutional: Pt appears in NAD               HENT: Head: NCAT.                Right Ear: External ear normal.                 Left Ear: External ear normal.                Eyes: . Pupils are equal, round, and reactive to light. Conjunctivae and EOM are normal               Nose: without d/c or deformity               Neck: Neck supple. Gross normal  ROM               Cardiovascular: Normal rate and regular rhythm.                 Pulmonary/Chest: Effort normal and breath sounds without rales or wheezing.                           Neurological: Pt is alert. At baseline orientation, motor grossly intact; right knee with 1+ effusion, warm, nontender with crepitus               Skin: Skin is warm. No rashes, no other new lesions, LE edema - none               Psychiatric: Pt behavior is normal without agitation   Micro: none  Cardiac tracings I have personally interpreted today:  none  Pertinent Radiological findings (summarize): none   Lab Results  Component Value Date   WBC 6.2 05/27/2023   HGB 14.0 05/27/2023   HCT 41.7 05/27/2023   PLT 286.0 05/27/2023   GLUCOSE 91 05/27/2023   CHOL 132 05/27/2023   TRIG 84.0 05/27/2023   HDL 58.90 05/27/2023   LDLCALC 56 05/27/2023   ALT 16 05/27/2023   AST 15 05/27/2023   NA 142 05/27/2023   K 3.8 05/27/2023   CL 104 05/27/2023   CREATININE 0.69 05/27/2023   BUN 15 05/27/2023   CO2 28 05/27/2023   TSH 3.89 05/27/2023   HGBA1C 5.9 05/27/2023   MICROALBUR 2.5 (H) 05/27/2023   Assessment/Plan:  [name] [name] is a 63 y.o. Black or African American [2] female  with  has a past medical history of ANEMIA-NOS (12/08/2006), BACK PAIN (12/08/2006), BREAST HYPERTROPHY (02/05/2008), DIVERTICULOSIS, COLON (04/18/2010), GERD (12/08/2006), Heart murmur, HYPERLIPIDEMIA (12/08/2006), HYPERTENSION (12/08/2006), HYPOTHYROIDISM (02/05/2008), LIPOMA (04/18/2010), Mild mitral regurgitation by prior echocardiogram (02/07/2011), OVERWEIGHT/OBESITY (01/11/2010), Shortness of breath (01/12/2009), SHOULDER PAIN, RIGHT (03/09/2007), URI (11/23/2009), and URINALYSIS, ABNORMAL (11/23/2009).  Vitamin D  deficiency Last vitamin D  Lab Results  Component Value Date   VD25OH 20.38 (L) 05/27/2023   Low, to start oral replacement   Hyperglycemia Lab Results  Component Value Date   HGBA1C 5.9 05/27/2023   Stable, pt to continue current medical treatment  - diet, wt control   Essential hypertension BP Readings from Last 3 Encounters:  02/16/24 (!) 142/100  01/24/24 (!) 145/72  05/31/23 121/87   Uncontrolled, likely reactive, pt to continue medical treatment hyzaar 50 12.5 every day - declines other change for now   Arthritis of right knee Mild to mod, for mobic  15 mg prn, and refer sports medicine,  to f/u any worsening symptoms or concerns  Followup: Return if symptoms worsen or fail to improve.  [name], MD 02/17/2024 1:53 PM [facility]  Primary Care - [facility]     [1]  Allergies Allergen Reactions   Propoxyphene Hcl     *DARVOCET*    REACTION: nausea  [2]  Current Outpatient Medications on File Prior to Visit  Medication Sig Dispense Refill   aspirin  81 MG EC tablet Take 1 tablet (81 mg total) by mouth daily. Swallow whole. 30 tablet 12   cyclobenzaprine  (FLEXERIL ) 5 MG tablet Take 1 tablet (5 mg total) by mouth 2 (two) times daily as needed for muscle spasms. 10 tablet 0   hydrocortisone 2.5 % cream Apply topically 2 (two) times daily.     hydroquinone 4 % cream  Apply topically at bedtime.     KLOR-CON  M10 10  MEQ tablet TAKE 1 TABLET BY MOUTH EVERY DAY 90 tablet 2   levothyroxine  (SYNTHROID ) 50 MCG tablet TAKE 1 TABLET BY MOUTH EVERY DAY 90 tablet 3   losartan -hydrochlorothiazide  (HYZAAR) 50-12.5 MG tablet TAKE 1 TABLET BY MOUTH EVERY DAY 90 tablet 3   pimecrolimus (ELIDEL) 1 % cream Apply topically daily.     rosuvastatin  (CRESTOR ) 20 MG tablet TAKE 1 TABLET BY MOUTH EVERY DAY 90 tablet 3   triamcinolone  (NASACORT ) 55 MCG/ACT AERO nasal inhaler Place 2 sprays into the nose daily. INSTILL 2 SPRAYS INTO NOSTRIL DAILY Strength: 55 MCG/ACT 1 each 3   triamcinolone  cream (KENALOG ) 0.1 % Apply topically.     No current facility-administered medications on file prior to visit.

## 2024-02-17 ENCOUNTER — Encounter: Payer: Self-pay | Admitting: Internal Medicine

## 2024-02-17 DIAGNOSIS — M1711 Unilateral primary osteoarthritis, right knee: Secondary | ICD-10-CM | POA: Insufficient documentation

## 2024-02-17 NOTE — Assessment & Plan Note (Signed)
 Mild to mod, for mobic  15 mg prn, and refer sports medicine,  to f/u any worsening symptoms or concerns

## 2024-02-17 NOTE — Assessment & Plan Note (Signed)
 Lab Results  Component Value Date   HGBA1C 5.9 05/27/2023   Stable, pt to continue current medical treatment  - diet, wt control

## 2024-02-17 NOTE — Assessment & Plan Note (Signed)
 Last vitamin D Lab Results  Component Value Date   VD25OH 20.38 (L) 05/27/2023   Low, to start oral replacement

## 2024-02-17 NOTE — Assessment & Plan Note (Signed)
 BP Readings from Last 3 Encounters:  02/16/24 (!) 142/100  01/24/24 (!) 145/72  05/31/23 121/87   Uncontrolled, likely reactive, pt to continue medical treatment hyzaar 50 12.5 every day - declines other change for now

## 2024-04-01 NOTE — Progress Notes (Unsigned)
"       ° °  LILLETTE Ileana Collet, PhD, LAT, ATC acting as a scribe for Artist Lloyd, MD.  [name] [name] is a 64 y.o. female who presents to [facility] at [facility] today for R knee pain ongoing since early December?*** Pain started in more of her lower leg, evaluated at [facility] on [facility] 29th. Pt locates pain to ***  R Knee swelling: Mechanical symptoms: Aggravates: Treatments tried: meloxicam   Dx testing: 01/25/24 R LE vasc US  01/24/24 R knee XR  Pertinent review of systems: ***  Relevant historical information: ***   Exam:  LMP  (LMP Unknown)  General: Well Developed, well nourished, and in no acute distress.   MSK: ***    Lab and Radiology Results No results found for this or any previous visit (from the past 72 hours). No results found.     Assessment and Plan: 64 y.o. female with ***   PDMP not reviewed this encounter. No orders of the defined types were placed in this encounter.  No orders of the defined types were placed in this encounter.    Discussed warning signs or symptoms. Please see discharge instructions. Patient expresses understanding.   ***  "

## 2024-04-02 DIAGNOSIS — R946 Abnormal results of thyroid function studies: Secondary | ICD-10-CM | POA: Insufficient documentation

## 2024-04-02 DIAGNOSIS — N951 Menopausal and female climacteric states: Secondary | ICD-10-CM | POA: Insufficient documentation

## 2024-04-05 ENCOUNTER — Ambulatory Visit: Admitting: Family Medicine
